# Patient Record
Sex: Male | Born: 1965 | Race: White | Hispanic: No | Marital: Married | Smoking: Never smoker
Health system: Southern US, Community
[De-identification: ages and names within clinical notes are randomized; demographics above are authoritative.]

## PROBLEM LIST (undated history)

## (undated) DIAGNOSIS — M1990 Unspecified osteoarthritis, unspecified site: Secondary | ICD-10-CM

## (undated) HISTORY — PX: SHOULDER ARTHROSCOPY: SHX128

---
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
## 2000-12-07 HISTORY — PX: RHINOPLASTY: SUR1284

## 2006-10-05 ENCOUNTER — Ambulatory Visit: Payer: Self-pay | Admitting: Family Medicine

## 2008-08-21 ENCOUNTER — Ambulatory Visit: Payer: Self-pay | Admitting: Family Medicine

## 2008-08-21 LAB — CONVERTED CEMR LAB
Bilirubin Urine: NEGATIVE
Blood in Urine, dipstick: NEGATIVE
Ketones, urine, test strip: NEGATIVE
Protein, U semiquant: NEGATIVE

## 2008-08-24 LAB — CONVERTED CEMR LAB
ALT: 24 units/L (ref 0–53)
AST: 21 units/L (ref 0–37)
Alkaline Phosphatase: 64 units/L (ref 39–117)
Basophils Absolute: 0.1 10*3/uL (ref 0.0–0.1)
Basophils Relative: 0.7 % (ref 0.0–3.0)
Bilirubin, Direct: 0.1 mg/dL (ref 0.0–0.3)
CO2: 31 meq/L (ref 19–32)
Chloride: 111 meq/L (ref 96–112)
GFR calc Af Amer: 135 mL/min
GFR calc non Af Amer: 112 mL/min
Glucose, Bld: 99 mg/dL (ref 70–99)
HDL: 32.8 mg/dL — ABNORMAL LOW (ref >39.0)
Hemoglobin: 15.2 g/dL (ref 13.0–17.0)
Lymphocytes Relative: 41.8 % (ref 12.0–46.0)
MCHC: 34.6 g/dL (ref 30.0–36.0)
Monocytes Relative: 8 % (ref 3.0–12.0)
Potassium: 4.3 meq/L (ref 3.5–5.1)
RBC: 4.59 M/uL (ref 4.22–5.81)
Total Protein: 7.1 g/dL (ref 6.0–8.3)
VLDL: 29 mg/dL (ref 0–40)
WBC: 7.4 10*3/uL (ref 4.5–10.5)

## 2008-08-28 ENCOUNTER — Ambulatory Visit: Payer: Self-pay | Admitting: Family Medicine

## 2008-12-07 HISTORY — PX: HIP ARTHROSCOPY: SUR88

## 2009-06-06 ENCOUNTER — Encounter: Admission: RE | Admit: 2009-06-06 | Discharge: 2009-06-06 | Payer: Self-pay | Admitting: Orthopedic Surgery

## 2009-09-11 ENCOUNTER — Ambulatory Visit (HOSPITAL_COMMUNITY): Admission: RE | Admit: 2009-09-11 | Discharge: 2009-09-11 | Payer: Self-pay | Admitting: Orthopedic Surgery

## 2010-09-20 IMAGING — RF DG HIP OPERATIVE*L*
1 series · 1 of 1 positions shown · non-contrast
Comparison: None

CLINICAL DATA: Intraoperative left hip for arthroscopy.

OPERATIVE LEFT HIP

[Series 1: run · 1 of 1 slices shown]
[im 1/1]
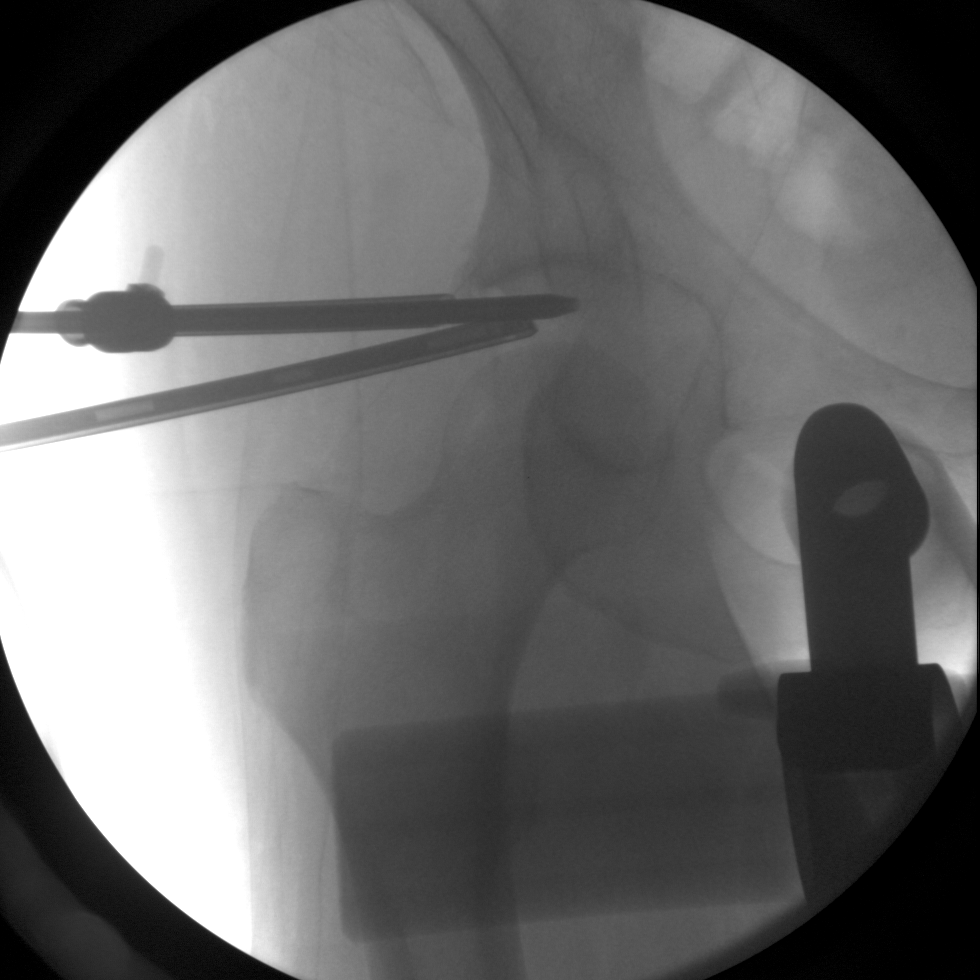

[1 of 1 positions shown; findings below may reference images not displayed]

FINDINGS: A single frontal view demonstrates surgical devices
projecting over the hip joint superiorly.  No acute findings.
IMPRESSION: Intraoperative localization of the hip joint.

## 2011-03-12 LAB — CBC
Hemoglobin: 15.4 g/dL (ref 13.0–17.0)
WBC: 9.4 10*3/uL (ref 4.0–10.5)

## 2011-12-08 HISTORY — PX: SHOULDER ARTHROSCOPY: SHX128

## 2014-03-29 ENCOUNTER — Other Ambulatory Visit: Payer: Self-pay | Admitting: Orthopedic Surgery

## 2014-04-19 NOTE — Pre-Procedure Instructions (Addendum)
Alcide Evenerhomas F Uptain  04/19/2014   Your procedure is scheduled on:  Friday, May 29th  Report to [facility] Admitting at 1015 AM.  Call this number if you have problems the morning of surgery: [phone]   Remember:   Do not eat food or drink liquids after midnight.   Take these medicines the morning of surgery with A SIP OF WATER: pain med if needed,adderall,        Take all meds as ordered until day of surgery except as instructed below pr per dr       Despina AriasSTOP all herbel meds, nsaids (aleve,naproxen,advil,ibuprofen) 5 days prior to surgery including aspirin, vitamin   Do not wear jewelry, make-up or nail polish.  Do not wear lotions, powders, or perfumes. You may wear deodorant.  Do not shave 48 hours prior to surgery. Men may shave face and neck.  Do not bring valuables to the hospital.  [facility] is not responsible  for any belongings or valuables.               Contacts, dentures or bridgework may not be worn into surgery.  Leave suitcase in the car. After surgery it may be brought to your room.  For patients admitted to the hospital, discharge time is determined by your   treatment team.  Please read over the following fact sheets that you were given: Pain Booklet, Coughing and Deep Breathing, Blood Transfusion Information, MRSA Information and Surgical Site Infection Prevention [place] - Preparing for Surgery  Before surgery, you can play an important role.  Because skin is not sterile, your skin needs to be as free of germs as possible.  You can reduce the number of germs on you skin by washing with CHG (chlorahexidine gluconate) soap before surgery.  CHG is an antiseptic cleaner which kills germs and bonds with the skin to continue killing germs even after washing.  Please DO NOT use if you have an allergy to CHG or antibacterial soaps.  If your skin becomes reddened/irritated stop using the CHG and inform your nurse when you arrive at Short Stay.  Do not shave  (including legs and underarms) for at least 48 hours prior to the first CHG shower.  You may shave your face.  Please follow these instructions carefully:   1.  Shower with CHG Soap the night before surgery and the morning of Surgery.  2.  If you choose to wash your hair, wash your hair first as usual with your normal shampoo.  3.  After you shampoo, rinse your hair and body thoroughly to remove the shampoo.  4.  Use CHG as you would any other liquid soap.  You can apply CHG directly to the skin and wash gently with scrungie or a clean washcloth.  5.  Apply the CHG Soap to your body ONLY FROM THE NECK DOWN.  Do not use on open wounds or open sores.  Avoid contact with your eyes, ears, mouth and genitals (private parts).  Wash genitals (private parts) with your normal soap.  6.  Wash thoroughly, paying special attention to the area where your surgery will be performed.  7.  Thoroughly rinse your body with warm water from the neck down.  8.  DO NOT shower/wash with your normal soap after using and rinsing off the CHG Soap.  9.  Pat yourself dry with a clean towel.            10.  Wear clean pajamas.  11.  Place clean sheets on your bed the night of your first shower and do not sleep with pets.  Day of Surgery  Do not apply any lotions/deoderants the morning of surgery.  Please wear clean clothes to the hospital/surgery center.

## 2014-04-20 ENCOUNTER — Encounter (HOSPITAL_COMMUNITY)
Admission: RE | Admit: 2014-04-20 | Discharge: 2014-04-20 | Disposition: A | Discharge status: Home / Self Care | Payer: BC Managed Care – PPO | Source: Ambulatory Visit | Attending: Orthopedic Surgery | Admitting: Orthopedic Surgery

## 2014-04-20 ENCOUNTER — Encounter (HOSPITAL_COMMUNITY): Payer: Self-pay

## 2014-04-20 ENCOUNTER — Encounter (HOSPITAL_COMMUNITY): Payer: Self-pay | Admitting: Pharmacy Technician

## 2014-04-20 DIAGNOSIS — Z01818 Encounter for other preprocedural examination: Secondary | ICD-10-CM | POA: Insufficient documentation

## 2014-04-20 DIAGNOSIS — Z01812 Encounter for preprocedural laboratory examination: Secondary | ICD-10-CM | POA: Insufficient documentation

## 2014-04-20 DIAGNOSIS — Z01810 Encounter for preprocedural cardiovascular examination: Secondary | ICD-10-CM | POA: Insufficient documentation

## 2014-04-20 HISTORY — DX: Unspecified osteoarthritis, unspecified site: M19.90

## 2014-04-20 LAB — CBC WITH DIFFERENTIAL/PLATELET
BASOS ABS: 0.1 10*3/uL (ref 0.0–0.1)
Basophils Relative: 1 % (ref 0–1)
Eosinophils Absolute: 0.4 10*3/uL (ref 0.0–0.7)
Eosinophils Relative: 5 % (ref 0–5)
HCT: 44 % (ref 39.0–52.0)
Hemoglobin: 15.4 g/dL (ref 13.0–17.0)
LYMPHS ABS: 3.3 10*3/uL (ref 0.7–4.0)
LYMPHS PCT: 42 % (ref 12–46)
MCH: 32.1 pg (ref 26.0–34.0)
MCHC: 35 g/dL (ref 30.0–36.0)
MCV: 91.7 fL (ref 78.0–100.0)
MONO ABS: 0.6 10*3/uL (ref 0.1–1.0)
Monocytes Relative: 7 % (ref 3–12)
NEUTROS ABS: 3.6 10*3/uL (ref 1.7–7.7)
Neutrophils Relative %: 45 % (ref 43–77)
Platelets: 226 10*3/uL (ref 150–400)
RBC: 4.8 MIL/uL (ref 4.22–5.81)
RDW: 12.6 % (ref 11.5–15.5)
WBC: 8 10*3/uL (ref 4.0–10.5)

## 2014-04-20 LAB — URINE MICROSCOPIC-ADD ON

## 2014-04-20 LAB — APTT: APTT: 29 s (ref 24–37)

## 2014-04-20 LAB — TYPE AND SCREEN
ABO/RH(D): B POS
ANTIBODY SCREEN: NEGATIVE

## 2014-04-20 LAB — URINALYSIS, ROUTINE W REFLEX MICROSCOPIC
BILIRUBIN URINE: NEGATIVE
Glucose, UA: NEGATIVE mg/dL
Hgb urine dipstick: NEGATIVE
KETONES UR: NEGATIVE mg/dL
Leukocytes, UA: NEGATIVE
Nitrite: NEGATIVE
PH: 6 (ref 5.0–8.0)
Protein, ur: 30 mg/dL — AB
SPECIFIC GRAVITY, URINE: 1.026 (ref 1.005–1.030)
Urobilinogen, UA: 0.2 mg/dL (ref 0.0–1.0)

## 2014-04-20 LAB — BASIC METABOLIC PANEL
BUN: 13 mg/dL (ref 6–23)
CALCIUM: 9.3 mg/dL (ref 8.4–10.5)
CHLORIDE: 104 meq/L (ref 96–112)
CO2: 22 meq/L (ref 19–32)
Creatinine, Ser: 0.77 mg/dL (ref 0.50–1.35)
GFR calc non Af Amer: >90 mL/min (ref >90)
GLUCOSE: 108 mg/dL — AB (ref 70–99)
POTASSIUM: 4.8 meq/L (ref 3.7–5.3)
SODIUM: 141 meq/L (ref 137–147)

## 2014-04-20 LAB — PROTIME-INR
INR: 0.98 (ref 0.00–1.49)
Prothrombin Time: 12.8 seconds (ref 11.6–15.2)

## 2014-04-20 LAB — SURGICAL PCR SCREEN
MRSA, PCR: NEGATIVE
Staphylococcus aureus: POSITIVE — AB

## 2014-04-20 LAB — ABO/RH: ABO/RH(D): B POS

## 2014-05-03 MED ORDER — CEFAZOLIN SODIUM-DEXTROSE 2-3 GM-% IV SOLR
2.0000 g | INTRAVENOUS | Status: AC
  Administered 2014-05-04: 2 g via INTRAVENOUS
  Filled 2014-05-03: qty 50

## 2014-05-03 MED ORDER — CHLORHEXIDINE GLUCONATE 4 % EX LIQD
60.0000 mL | Freq: Once | CUTANEOUS | Status: DC
  Filled 2014-05-03: qty 60

## 2014-05-03 MED ORDER — DEXTROSE-NACL 5-0.45 % IV SOLN: INTRAVENOUS | Status: DC

## 2014-05-03 NOTE — H&P (Signed)
TOTAL HIP ADMISSION H&P  Patient is admitted for left total hip arthroplasty.  Subjective:  Chief Complaint: left hip pain  HPI: Mathew Vega, 48 y.o. male, has a history of pain and functional disability in the left hip(s) due to arthritis and patient has failed non-surgical conservative treatments for greater than 12 weeks to include NSAID's and/or analgesics, corticosteriod injections, weight reduction as appropriate and activity modification.  Onset of symptoms was gradual starting 7 years ago with gradually worsening course since that time.The patient noted no past surgery on the left hip(s).  Patient currently rates pain in the left hip at 10 out of 10 with activity. Patient has night pain, worsening of pain with activity and weight bearing, pain that interfers with activities of daily living and pain with passive range of motion. Patient has evidence of joint space narrowing by imaging studies. This condition presents safety issues increasing the risk of falls. There is no current active infection.  There are no active problems to display for this patient.  Past Medical History  Diagnosis Date  . Arthritis     Past Surgical History  Procedure Laterality Date  . Appendectomy  80  . Rhinoplasty  02  . Hip arthroscopy Left 10  . Shoulder arthroscopy Right 13    cartilage repair    No prescriptions prior to admission   Not on File  History  Substance Use Topics  . Smoking status: Never Smoker   . Smokeless tobacco: Not on file  . Alcohol Use: 2.4 oz/week    4 Glasses of wine per week    No family history on file.   Review of Systems  Constitutional: Negative.   HENT: Negative.   Eyes: Negative.   Respiratory: Negative.   Cardiovascular: Negative.   Gastrointestinal: Negative.   Genitourinary: Negative.   Musculoskeletal: Positive for joint pain.  Skin: Negative.   Neurological: Negative.   Endo/Heme/Allergies: Negative.   Psychiatric/Behavioral: Negative.      Objective:  Physical Exam  Constitutional: He is oriented to person, place, and time. He appears well-developed and well-nourished.  HENT:  Head: Normocephalic and atraumatic.  Eyes: Pupils are equal, round, and reactive to light.  Neck: Normal range of motion. Neck supple.  Cardiovascular: Intact distal pulses.   Respiratory: Effort normal.  Musculoskeletal: He exhibits tenderness.  Patient's right hip has good strength good range of motion and no pain.  Patient's left hip does have obvious pain with hip flexion and extension.  He also has increased pain with internal and external rotation.  Patient has mild groin pain.  No lateral hip pain.  His calves are soft and nontender.  He is neurovascularly intact distally.  Neurological: He is alert and oriented to person, place, and time.  Skin: Skin is warm and dry.  Psychiatric: He has a normal mood and affect. His behavior is normal. Judgment and thought content normal.    Vital signs in last 24 hours:    Labs:   Estimated body mass index is 26.97 kg/(m^2) as calculated from the following:   Height as of 08/28/08: 5' 10.75" (1.797 m).   Weight as of 08/28/08: 87.091 kg (192 lb).   Imaging Review Pelvis AP and the left hip lateral 2 views x-ray was ordered, performed and interpreted by me in office, which shows end-stage degenerative joint disease on the left side with the bone on bone.  Assessment/Plan:  End stage arthritis, left hip(s)  The patient history, physical examination, clinical judgement of the  provider and imaging studies are consistent with end stage degenerative joint disease of the left hip(s) and total hip arthroplasty is deemed medically necessary. The treatment options including medical management, injection therapy, arthroscopy and arthroplasty were discussed at length. The risks and benefits of total hip arthroplasty were presented and reviewed. The risks due to aseptic loosening, infection, stiffness,  dislocation/subluxation,  thromboembolic complications and other imponderables were discussed.  The patient acknowledged the explanation, agreed to proceed with the plan and consent was signed. Patient is being admitted for inpatient treatment for surgery, pain control, PT, OT, prophylactic antibiotics, VTE prophylaxis, progressive ambulation and ADL's and discharge planning.The patient is planning to be discharged home with home health services

## 2014-05-03 NOTE — Progress Notes (Signed)
Called and informed of time change.Pt to be here at 1052, voices understanding.

## 2014-05-04 ENCOUNTER — Encounter (HOSPITAL_COMMUNITY)
Admission: RE | Disposition: A | Discharge status: Home Health | Payer: Self-pay | Source: Ambulatory Visit | Attending: Orthopedic Surgery

## 2014-05-04 ENCOUNTER — Encounter (HOSPITAL_COMMUNITY): Payer: BC Managed Care – PPO | Admitting: Anesthesiology

## 2014-05-04 ENCOUNTER — Inpatient Hospital Stay (HOSPITAL_COMMUNITY): Payer: BC Managed Care – PPO | Admitting: Anesthesiology

## 2014-05-04 ENCOUNTER — Inpatient Hospital Stay (HOSPITAL_COMMUNITY): Payer: BC Managed Care – PPO

## 2014-05-04 ENCOUNTER — Encounter (HOSPITAL_COMMUNITY): Payer: Self-pay | Admitting: *Deleted

## 2014-05-04 ENCOUNTER — Inpatient Hospital Stay (HOSPITAL_COMMUNITY)
Admission: RE | Admit: 2014-05-04 | Discharge: 2014-05-05 | DRG: 470 | Disposition: A | Discharge status: Home Health | Payer: BC Managed Care – PPO | Source: Ambulatory Visit | Attending: Orthopedic Surgery | Admitting: Orthopedic Surgery

## 2014-05-04 DIAGNOSIS — M169 Osteoarthritis of hip, unspecified: Principal | ICD-10-CM | POA: Diagnosis present

## 2014-05-04 DIAGNOSIS — M1612 Unilateral primary osteoarthritis, left hip: Secondary | ICD-10-CM | POA: Diagnosis present

## 2014-05-04 DIAGNOSIS — M1610 Unilateral primary osteoarthritis, unspecified hip: Principal | ICD-10-CM | POA: Diagnosis present

## 2014-05-04 HISTORY — PX: INJECTION KNEE: SHX2446

## 2014-05-04 HISTORY — PX: TOTAL HIP ARTHROPLASTY: SHX124

## 2014-05-04 SURGERY — ARTHROPLASTY, HIP, TOTAL,POSTERIOR APPROACH
Anesthesia: General | Site: Knee | Laterality: Left

## 2014-05-04 MED ORDER — FENTANYL CITRATE 0.05 MG/ML IJ SOLN
INTRAMUSCULAR | Status: AC
  Filled 2014-05-04: qty 5

## 2014-05-04 MED ORDER — OXYCODONE HCL 5 MG PO TABS
5.0000 mg | ORAL_TABLET | Freq: Once | ORAL | Status: AC | PRN
  Administered 2014-05-04: 5 mg via ORAL

## 2014-05-04 MED ORDER — BUPIVACAINE-EPINEPHRINE (PF) 0.5% -1:200000 IJ SOLN
INTRAMUSCULAR | Status: AC
  Filled 2014-05-04: qty 30

## 2014-05-04 MED ORDER — ONDANSETRON HCL 4 MG/2ML IJ SOLN: 4.0000 mg | Freq: Once | INTRAMUSCULAR | Status: DC | PRN

## 2014-05-04 MED ORDER — ARTIFICIAL TEARS OP OINT
TOPICAL_OINTMENT | OPHTHALMIC | Status: DC | PRN
  Administered 2014-05-04: 1 via OPHTHALMIC

## 2014-05-04 MED ORDER — MAGNESIUM CITRATE PO SOLN: 1.0000 | Freq: Once | ORAL | Status: AC | PRN

## 2014-05-04 MED ORDER — SENNOSIDES-DOCUSATE SODIUM 8.6-50 MG PO TABS: 1.0000 | ORAL_TABLET | Freq: Every evening | ORAL | Status: DC | PRN

## 2014-05-04 MED ORDER — METOCLOPRAMIDE HCL 5 MG/ML IJ SOLN: 5.0000 mg | Freq: Three times a day (TID) | INTRAMUSCULAR | Status: DC | PRN

## 2014-05-04 MED ORDER — GLYCOPYRROLATE 0.2 MG/ML IJ SOLN
INTRAMUSCULAR | Status: AC
  Filled 2014-05-04: qty 2

## 2014-05-04 MED ORDER — OXYCODONE HCL 5 MG/5ML PO SOLN: 5.0000 mg | Freq: Once | ORAL | Status: AC | PRN

## 2014-05-04 MED ORDER — METHOCARBAMOL 500 MG PO TABS
500.0000 mg | ORAL_TABLET | Freq: Four times a day (QID) | ORAL | Status: DC | PRN
  Administered 2014-05-04 – 2014-05-05 (×3): 500 mg via ORAL
  Filled 2014-05-04 (×3): qty 1

## 2014-05-04 MED ORDER — HYDROMORPHONE HCL PF 1 MG/ML IJ SOLN
1.0000 mg | INTRAMUSCULAR | Status: DC | PRN
  Administered 2014-05-04: 1 mg via INTRAVENOUS
  Filled 2014-05-04 (×2): qty 1

## 2014-05-04 MED ORDER — GLYCOPYRROLATE 0.2 MG/ML IJ SOLN
INTRAMUSCULAR | Status: DC | PRN
  Administered 2014-05-04: 0.4 mg via INTRAVENOUS

## 2014-05-04 MED ORDER — DIPHENHYDRAMINE HCL 12.5 MG/5ML PO ELIX: 12.5000 mg | ORAL_SOLUTION | ORAL | Status: DC | PRN

## 2014-05-04 MED ORDER — OXYCODONE HCL 5 MG PO TABS
ORAL_TABLET | ORAL | Status: AC
  Filled 2014-05-04: qty 1

## 2014-05-04 MED ORDER — ONDANSETRON HCL 4 MG/2ML IJ SOLN: 4.0000 mg | Freq: Four times a day (QID) | INTRAMUSCULAR | Status: DC | PRN

## 2014-05-04 MED ORDER — BISACODYL 5 MG PO TBEC: 5.0000 mg | DELAYED_RELEASE_TABLET | Freq: Every day | ORAL | Status: DC | PRN

## 2014-05-04 MED ORDER — LACTATED RINGERS IV SOLN
INTRAVENOUS | Status: DC | PRN
  Administered 2014-05-04 (×2): via INTRAVENOUS

## 2014-05-04 MED ORDER — ARTIFICIAL TEARS OP OINT
TOPICAL_OINTMENT | OPHTHALMIC | Status: AC
Start: 2014-05-04 — End: 2014-05-04
  Filled 2014-05-04: qty 3.5

## 2014-05-04 MED ORDER — DOCUSATE SODIUM 100 MG PO CAPS
100.0000 mg | ORAL_CAPSULE | Freq: Two times a day (BID) | ORAL | Status: DC
  Administered 2014-05-05: 100 mg via ORAL
  Filled 2014-05-04 (×3): qty 1

## 2014-05-04 MED ORDER — NEOSTIGMINE METHYLSULFATE 10 MG/10ML IV SOLN
INTRAVENOUS | Status: AC
  Filled 2014-05-04: qty 1

## 2014-05-04 MED ORDER — BETAMETHASONE SOD PHOS & ACET 6 (3-3) MG/ML IJ SUSP
INTRAMUSCULAR | Status: DC | PRN
  Administered 2014-05-04: 18 mg via INTRAMUSCULAR

## 2014-05-04 MED ORDER — STERILE WATER FOR INJECTION IJ SOLN
INTRAMUSCULAR | Status: AC
  Filled 2014-05-04: qty 10

## 2014-05-04 MED ORDER — ACETAMINOPHEN 650 MG RE SUPP: 650.0000 mg | Freq: Four times a day (QID) | RECTAL | Status: DC | PRN

## 2014-05-04 MED ORDER — METHOCARBAMOL 1000 MG/10ML IJ SOLN
500.0000 mg | Freq: Four times a day (QID) | INTRAVENOUS | Status: DC | PRN
  Administered 2014-05-04: 500 mg via INTRAVENOUS
  Filled 2014-05-04 (×2): qty 5

## 2014-05-04 MED ORDER — AMPHETAMINE-DEXTROAMPHET ER 10 MG PO CP24
20.0000 mg | ORAL_CAPSULE | Freq: Every day | ORAL | Status: DC
  Administered 2014-05-05: 20 mg via ORAL
  Filled 2014-05-04: qty 2

## 2014-05-04 MED ORDER — LABETALOL HCL 5 MG/ML IV SOLN
INTRAVENOUS | Status: DC | PRN
Start: 2014-05-04 — End: 2014-05-04
  Administered 2014-05-04: 5 mg via INTRAVENOUS

## 2014-05-04 MED ORDER — HYDROMORPHONE HCL PF 1 MG/ML IJ SOLN
INTRAMUSCULAR | Status: AC
  Filled 2014-05-04: qty 1

## 2014-05-04 MED ORDER — ASPIRIN EC 325 MG PO TBEC
325.0000 mg | DELAYED_RELEASE_TABLET | Freq: Every day | ORAL | Status: DC
  Administered 2014-05-05: 325 mg via ORAL
  Filled 2014-05-04 (×2): qty 1

## 2014-05-04 MED ORDER — BETAMETHASONE SOD PHOS & ACET 6 (3-3) MG/ML IJ SUSP
18.0000 mg | Freq: Once | INTRAMUSCULAR | Status: DC
  Filled 2014-05-04: qty 3

## 2014-05-04 MED ORDER — PROPOFOL 10 MG/ML IV BOLUS
INTRAVENOUS | Status: DC | PRN
  Administered 2014-05-04: 200 mg via INTRAVENOUS

## 2014-05-04 MED ORDER — METHOCARBAMOL 500 MG PO TABS: 500.0000 mg | ORAL_TABLET | Freq: Two times a day (BID) | ORAL | Status: AC

## 2014-05-04 MED ORDER — CEFUROXIME SODIUM 1.5 G IJ SOLR
INTRAMUSCULAR | Status: AC
  Filled 2014-05-04: qty 1.5

## 2014-05-04 MED ORDER — OXYCODONE-ACETAMINOPHEN 5-325 MG PO TABS: 1.0000 | ORAL_TABLET | ORAL | Status: DC | PRN

## 2014-05-04 MED ORDER — 0.9 % SODIUM CHLORIDE (POUR BTL) OPTIME
TOPICAL | Status: DC | PRN
  Administered 2014-05-04: 1000 mL

## 2014-05-04 MED ORDER — LACTATED RINGERS IV SOLN
INTRAVENOUS | Status: DC
  Administered 2014-05-04: 11:00:00 via INTRAVENOUS

## 2014-05-04 MED ORDER — LIDOCAINE HCL (CARDIAC) 20 MG/ML IV SOLN
INTRAVENOUS | Status: DC | PRN
  Administered 2014-05-04: 100 mg via INTRATRACHEAL

## 2014-05-04 MED ORDER — ONDANSETRON HCL 4 MG/2ML IJ SOLN
INTRAMUSCULAR | Status: DC | PRN
Start: 2014-05-04 — End: 2014-05-04
  Administered 2014-05-04: 4 mg via INTRAVENOUS

## 2014-05-04 MED ORDER — LABETALOL HCL 5 MG/ML IV SOLN
INTRAVENOUS | Status: AC
  Filled 2014-05-04: qty 4

## 2014-05-04 MED ORDER — BUPIVACAINE-EPINEPHRINE 0.5% -1:200000 IJ SOLN
INTRAMUSCULAR | Status: DC | PRN
  Administered 2014-05-04: 17 mL

## 2014-05-04 MED ORDER — TRANEXAMIC ACID 100 MG/ML IV SOLN
1000.0000 mg | INTRAVENOUS | Status: AC
  Administered 2014-05-04: 1000 mg via INTRAVENOUS
  Filled 2014-05-04 (×2): qty 10

## 2014-05-04 MED ORDER — NEOSTIGMINE METHYLSULFATE 10 MG/10ML IV SOLN
INTRAVENOUS | Status: DC | PRN
  Administered 2014-05-04: 3 mg via INTRAVENOUS

## 2014-05-04 MED ORDER — HYDROMORPHONE HCL PF 1 MG/ML IJ SOLN
0.2500 mg | INTRAMUSCULAR | Status: DC | PRN
Start: 2014-05-04 — End: 2014-05-04
  Administered 2014-05-04 (×5): 0.5 mg via INTRAVENOUS

## 2014-05-04 MED ORDER — ASPIRIN EC 325 MG PO TBEC: 325.0000 mg | DELAYED_RELEASE_TABLET | Freq: Two times a day (BID) | ORAL | Status: DC

## 2014-05-04 MED ORDER — HYDROMORPHONE HCL PF 1 MG/ML IJ SOLN
0.5000 mg | INTRAMUSCULAR | Status: DC | PRN
  Administered 2014-05-04 (×2): 0.5 mg via INTRAVENOUS

## 2014-05-04 MED ORDER — OXYCODONE HCL 5 MG PO TABS
5.0000 mg | ORAL_TABLET | ORAL | Status: DC | PRN
  Administered 2014-05-04 – 2014-05-05 (×6): 10 mg via ORAL
  Filled 2014-05-04 (×6): qty 2

## 2014-05-04 MED ORDER — MIDAZOLAM HCL 2 MG/2ML IJ SOLN
INTRAMUSCULAR | Status: AC
  Filled 2014-05-04: qty 2

## 2014-05-04 MED ORDER — MENTHOL 3 MG MT LOZG: 1.0000 | LOZENGE | OROMUCOSAL | Status: DC | PRN

## 2014-05-04 MED ORDER — METOCLOPRAMIDE HCL 10 MG PO TABS: 5.0000 mg | ORAL_TABLET | Freq: Three times a day (TID) | ORAL | Status: DC | PRN

## 2014-05-04 MED ORDER — PHENOL 1.4 % MT LIQD: 1.0000 | OROMUCOSAL | Status: DC | PRN

## 2014-05-04 MED ORDER — LIDOCAINE HCL (CARDIAC) 20 MG/ML IV SOLN
INTRAVENOUS | Status: AC
  Filled 2014-05-04: qty 5

## 2014-05-04 MED ORDER — PROPOFOL 10 MG/ML IV BOLUS
INTRAVENOUS | Status: AC
  Filled 2014-05-04: qty 20

## 2014-05-04 MED ORDER — MIDAZOLAM HCL 5 MG/5ML IJ SOLN
INTRAMUSCULAR | Status: DC | PRN
  Administered 2014-05-04: 2 mg via INTRAVENOUS

## 2014-05-04 MED ORDER — ONDANSETRON HCL 4 MG/2ML IJ SOLN
INTRAMUSCULAR | Status: AC
  Filled 2014-05-04: qty 2

## 2014-05-04 MED ORDER — ACETAMINOPHEN 325 MG PO TABS
650.0000 mg | ORAL_TABLET | Freq: Four times a day (QID) | ORAL | Status: DC | PRN
  Administered 2014-05-05: 650 mg via ORAL
  Filled 2014-05-04: qty 2

## 2014-05-04 MED ORDER — ONDANSETRON HCL 4 MG PO TABS: 4.0000 mg | ORAL_TABLET | Freq: Four times a day (QID) | ORAL | Status: DC | PRN

## 2014-05-04 MED ORDER — ROCURONIUM BROMIDE 100 MG/10ML IV SOLN
INTRAVENOUS | Status: DC | PRN
  Administered 2014-05-04: 50 mg via INTRAVENOUS

## 2014-05-04 MED ORDER — VECURONIUM BROMIDE 10 MG IV SOLR
INTRAVENOUS | Status: AC
  Filled 2014-05-04: qty 10

## 2014-05-04 MED ORDER — AMPHETAMINE-DEXTROAMPHETAMINE 10 MG PO TABS
10.0000 mg | ORAL_TABLET | Freq: Every day | ORAL | Status: DC
  Administered 2014-05-05: 10 mg via ORAL
  Filled 2014-05-04: qty 1

## 2014-05-04 MED ORDER — FENTANYL CITRATE 0.05 MG/ML IJ SOLN
INTRAMUSCULAR | Status: DC | PRN
  Administered 2014-05-04: 100 ug via INTRAVENOUS
  Administered 2014-05-04 (×2): 50 ug via INTRAVENOUS
  Administered 2014-05-04: 100 ug via INTRAVENOUS
  Administered 2014-05-04: 50 ug via INTRAVENOUS
  Administered 2014-05-04: 100 ug via INTRAVENOUS

## 2014-05-04 MED ORDER — KCL IN DEXTROSE-NACL 20-5-0.45 MEQ/L-%-% IV SOLN
INTRAVENOUS | Status: DC
  Administered 2014-05-04: 19:00:00 via INTRAVENOUS
  Filled 2014-05-04 (×4): qty 1000

## 2014-05-04 SURGICAL SUPPLY — 57 items
BLADE SAW SGTL 18X1.27X75 (BLADE) ×3 IMPLANT
BLADE SAW SGTL 18X1.27X75MM (BLADE) ×1
BRUSH FEMORAL CANAL (MISCELLANEOUS) IMPLANT
CAPT HIP PF COP ×4 IMPLANT
COVER BACK TABLE 24X17X13 BIG (DRAPES) IMPLANT
COVER SURGICAL LIGHT HANDLE (MISCELLANEOUS) ×4 IMPLANT
DRAPE ORTHO SPLIT 77X108 STRL (DRAPES) ×2
DRAPE PROXIMA HALF (DRAPES) ×4 IMPLANT
DRAPE SURG ORHT 6 SPLT 77X108 (DRAPES) ×2 IMPLANT
DRAPE U-SHAPE 47X51 STRL (DRAPES) ×4 IMPLANT
DRILL BIT 7/64X5 (BIT) ×4 IMPLANT
DRSG AQUACEL AG ADV 3.5X10 (GAUZE/BANDAGES/DRESSINGS) ×4 IMPLANT
DURAPREP 26ML APPLICATOR (WOUND CARE) ×4 IMPLANT
ELECT BLADE 4.0 EZ CLEAN MEGAD (MISCELLANEOUS) ×4
ELECT REM PT RETURN 9FT ADLT (ELECTROSURGICAL) ×4
ELECTRODE BLDE 4.0 EZ CLN MEGD (MISCELLANEOUS) ×2 IMPLANT
ELECTRODE REM PT RTRN 9FT ADLT (ELECTROSURGICAL) ×2 IMPLANT
GAUZE XEROFORM 1X8 LF (GAUZE/BANDAGES/DRESSINGS) ×4 IMPLANT
GLOVE BIO SURGEON STRL SZ7.5 (GLOVE) ×4 IMPLANT
GLOVE BIO SURGEON STRL SZ8.5 (GLOVE) ×8 IMPLANT
GLOVE BIOGEL PI IND STRL 6.5 (GLOVE) ×4 IMPLANT
GLOVE BIOGEL PI IND STRL 8 (GLOVE) ×4 IMPLANT
GLOVE BIOGEL PI IND STRL 9 (GLOVE) ×2 IMPLANT
GLOVE BIOGEL PI INDICATOR 6.5 (GLOVE) ×4
GLOVE BIOGEL PI INDICATOR 8 (GLOVE) ×4
GLOVE BIOGEL PI INDICATOR 9 (GLOVE) ×2
GLOVE SURG SS PI 6.5 STRL IVOR (GLOVE) ×4 IMPLANT
GOWN STRL REUS W/ TWL LRG LVL3 (GOWN DISPOSABLE) ×4 IMPLANT
GOWN STRL REUS W/ TWL XL LVL3 (GOWN DISPOSABLE) ×6 IMPLANT
GOWN STRL REUS W/TWL LRG LVL3 (GOWN DISPOSABLE) ×4
GOWN STRL REUS W/TWL XL LVL3 (GOWN DISPOSABLE) ×6
HANDPIECE INTERPULSE COAX TIP (DISPOSABLE)
HOOD PEEL AWAY FACE SHEILD DIS (HOOD) ×8 IMPLANT
KIT BASIN OR (CUSTOM PROCEDURE TRAY) ×4 IMPLANT
KIT ROOM TURNOVER OR (KITS) ×4 IMPLANT
MANIFOLD NEPTUNE II (INSTRUMENTS) ×4 IMPLANT
NEEDLE 22X1 1/2 (OR ONLY) (NEEDLE) ×4 IMPLANT
NS IRRIG 1000ML POUR BTL (IV SOLUTION) ×4 IMPLANT
PACK TOTAL JOINT (CUSTOM PROCEDURE TRAY) ×4 IMPLANT
PAD ARMBOARD 7.5X6 YLW CONV (MISCELLANEOUS) ×8 IMPLANT
PASSER SUT SWANSON 36MM LOOP (INSTRUMENTS) ×4 IMPLANT
PRESSURIZER FEMORAL UNIV (MISCELLANEOUS) IMPLANT
SET HNDPC FAN SPRY TIP SCT (DISPOSABLE) IMPLANT
SUT ETHIBOND 2 V 37 (SUTURE) ×4 IMPLANT
SUT VIC AB 0 CTB1 27 (SUTURE) ×4 IMPLANT
SUT VIC AB 1 CTX 27 (SUTURE) ×4 IMPLANT
SUT VIC AB 1 CTX 36 (SUTURE) ×2
SUT VIC AB 1 CTX36XBRD ANBCTR (SUTURE) ×2 IMPLANT
SUT VIC AB 2-0 CTB1 (SUTURE) ×4 IMPLANT
SUT VIC AB 3-0 SH 27 (SUTURE) ×2
SUT VIC AB 3-0 SH 27X BRD (SUTURE) ×2 IMPLANT
SYR CONTROL 10ML LL (SYRINGE) ×4 IMPLANT
TOWEL OR 17X24 6PK STRL BLUE (TOWEL DISPOSABLE) ×4 IMPLANT
TOWEL OR 17X26 10 PK STRL BLUE (TOWEL DISPOSABLE) ×4 IMPLANT
TOWER CARTRIDGE SMART MIX (DISPOSABLE) IMPLANT
TRAY FOLEY CATH 14FR (SET/KITS/TRAYS/PACK) IMPLANT
WATER STERILE IRR 1000ML POUR (IV SOLUTION) ×4 IMPLANT

## 2014-05-04 NOTE — Anesthesia Postprocedure Evaluation (Signed)
  Anesthesia Post-op Note  Patient: Mathew Vega  Procedure(s) Performed: Procedure(s): TOTAL HIP ARTHROPLASTY (Left) CORTISONE INJECTION LEFT KNEE (Left)  Patient Location: PACU  Anesthesia Type:General  Level of Consciousness: awake, alert  and oriented  Airway and Oxygen Therapy: Patient Spontanous Breathing and Patient connected to nasal cannula oxygen  Post-op Pain: mild  Post-op Assessment: Post-op Vital signs reviewed  Post-op Vital Signs: Reviewed  Last Vitals:  Filed Vitals:   05/04/14 1515  BP: 140/83  Pulse: 59  Temp:   Resp: 7    Complications: No apparent anesthesia complications

## 2014-05-04 NOTE — Transfer of Care (Signed)
Immediate Anesthesia Transfer of Care Note  Patient: Mathew Vega  Procedure(s) Performed: Procedure(s): TOTAL HIP ARTHROPLASTY (Left) CORTISONE INJECTION LEFT KNEE (Left)  Patient Location: PACU  Anesthesia Type:General  Level of Consciousness: awake, oriented and patient cooperative  Airway & Oxygen Therapy: Patient Spontanous Breathing and Patient connected to nasal cannula oxygen  Post-op Assessment: Report given to PACU RN and Post -op Vital signs reviewed and stable  Post vital signs: Reviewed  Complications: No apparent anesthesia complications

## 2014-05-04 NOTE — Interval H&P Note (Signed)
History and Physical Interval Note:  05/04/2014 12:10 PM  Mathew Vega  has presented today for surgery, with the diagnosis of LEFT HIP OSTEOARTHRITIS  The various methods of treatment have been discussed with the patient and family. After consideration of risks, benefits and other options for treatment, the patient has consented to  Procedure(s): TOTAL HIP ARTHROPLASTY (Left) as a surgical intervention .  The patient's history has been reviewed, patient examined, no change in status, stable for surgery.  I have reviewed the patient's chart and labs.  Questions were answered to the patient's satisfaction.     Nestor Lewandowsky

## 2014-05-04 NOTE — Anesthesia Preprocedure Evaluation (Addendum)
Anesthesia Evaluation  Patient identified by MRN, date of birth, ID band Patient awake    Reviewed: Allergy & Precautions, H&P , NPO status , Patient's Chart, lab work & pertinent test results  History of Anesthesia Complications Negative for: history of anesthetic complications  Airway Mallampati: II TM Distance: >3 FB Neck ROM: Full    Dental  (+) Teeth Intact, Dental Advisory Given   Pulmonary neg pulmonary ROS,  breath sounds clear to auscultation        Cardiovascular negative cardio ROS  Rhythm:Regular Rate:Normal     Neuro/Psych negative psych ROS   GI/Hepatic negative GI ROS, Neg liver ROS,   Endo/Other  negative endocrine ROS  Renal/GU negative Renal ROS     Musculoskeletal   Abdominal   Peds  Hematology negative hematology ROS (+)   Anesthesia Other Findings   Reproductive/Obstetrics negative OB ROS                          Anesthesia Physical Anesthesia Plan  ASA: I  Anesthesia Plan: General   Post-op Pain Management:    Induction: Intravenous  Airway Management Planned: Oral ETT  Additional Equipment:   Intra-op Plan:   Post-operative Plan: Extubation in OR  Informed Consent: I have reviewed the patients History and Physical, chart, labs and discussed the procedure including the risks, benefits and alternatives for the proposed anesthesia with the patient or authorized representative who has indicated his/her understanding and acceptance.   Dental advisory given  Plan Discussed with: CRNA, Anesthesiologist and Surgeon  Anesthesia Plan Comments:         Anesthesia Quick Evaluation

## 2014-05-04 NOTE — H&P (View-Only) (Signed)
Called and informed of time change.Pt to be here at 1052, voices understanding. 

## 2014-05-04 NOTE — Plan of Care (Signed)
Problem: Consults Goal: Diagnosis- Total Joint Replacement Primary Total Hip Left     

## 2014-05-04 NOTE — Anesthesia Procedure Notes (Signed)
Procedure Name: Intubation Date/Time: 05/04/2014 1:10 PM Performed by: Romie Minus K Pre-anesthesia Checklist: Patient identified, Emergency Drugs available, Suction available, Patient being monitored and Timeout performed Patient Re-evaluated:Patient Re-evaluated prior to inductionOxygen Delivery Method: Circle system utilized Preoxygenation: Pre-oxygenation with 100% oxygen Intubation Type: IV induction Ventilation: Mask ventilation without difficulty and Oral airway inserted - appropriate to patient size Laryngoscope Size: Miller and 2 Grade View: Grade I Tube type: Oral Tube size: 7.5 mm Number of attempts: 1 Airway Equipment and Method: Stylet Placement Confirmation: ETT inserted through vocal cords under direct vision,  positive ETCO2,  CO2 detector and breath sounds checked- equal and bilateral Secured at: 22 cm Tube secured with: Tape Dental Injury: Teeth and Oropharynx as per pre-operative assessment

## 2014-05-04 NOTE — Op Note (Signed)
OPERATIVE REPORT    DATE OF PROCEDURE:  05/04/2014       PREOPERATIVE DIAGNOSIS:  LEFT HIP OSTEOARTHRITIS                                                          POSTOPERATIVE DIAGNOSIS:  LEFT HIP OSTEOARTHRITIS                                                           PROCEDURE:  L total hip arthroplasty using a 54 mm DePuy Pinnacle  Cup, Pe[employer]ody E[employer]x Hole Eliminator, 10-degree polyethylene liner index superior  and posterior, a +0 36 mm ceramic head, a [id number] SROM stem, 18FL Sleeve   SURGEON: Nestor LewandowskyFrank J Favor Kreh    ASSISTANT:   Tomi LikensEric K. [employer] PA-C  (present throughout entire procedure and necessary for timely completion of the procedure)   ANESTHESIA: General BLOOD LOSS: 500 FLUID REPLACEMENT: 1800 crystalloid DRAINS: Foley Catheter URINE OUTPUT: 300cc COMPLICATIONS: none    INDICATIONS FOR PROCEDURE: A 48 y.o. year-old With  LEFT HIP OSTEOARTHRITIS   for 2 years, x-rays show bone-on-bone arthritic changes. Despite conservative measures with observation, anti-inflammatory medicine, narcotics, use of a cane, has severe unremitting pain and can ambulate only a few blocks before resting.  Patient desires elective L total hip arthroplasty to decrease pain and increase function. The risks, benefits, and alternatives were discussed at length including but not limited to the risks of infection, bleeding, nerve injury, stiffness, blood clots, the need for revision surgery, cardiopulmonary complications, among others, and they were willing to proceed. Questions answered     PROCEDURE IN DETAIL: The patient was identified by armband,  received preoperative [facility], taken to the operating room , appropriate anesthetic monitors  were attached and general endotracheal anesthesia induced. Foley catheter was inserted. Pt was rolled into the R lateral decubitus position and fixed there with a Stulberg Mark II pelvic clamp.  The L lower extremity was then  prepped and draped  in the usual sterile fashion from the ankle to the hemipelvis. A time-out  procedure was performed. The skin along the lateral hip and thigh  infiltrated with 10 mL of 0.5% Marcaine and epinephrine solution. We  then made a posterolateral approach to the hip. With a #10 blade, a 15 cm  incision was made through the skin and subcutaneous tissue down to the level of the  IT band. Small bleeders were identified and cauterized. The IT band was cut in  line with skin incision exposing the greater trochanter. A Cobra retractor was placed between the gluteus minimus and the superior hip joint capsule, and a spiked Cobra between the quadratus femoris and the inferior hip joint capsule. This isolated the short  external rotators and piriformis tendons. These were tagged with a #2 Ethibond  suture and cut off their insertion on the intertrochanteric crest. The posterior  capsule was then developed into an acetabular-based flap from Posterior Superior off of the acetabulum out over the femoral neck and back posterior inferior to the acetabular rim. This flap was tagged with two #2  Ethibond sutures and retracted protecting the sciatic nerve. This exposed the arthritic femoral head and osteophytes. The hip was then flexed and internally rotated, dislocating the femoral head and a standard neck cut performed 1 fingerbreadth above the lesser trochanter.  A spiked Cobra was placed in the cotyloid notch and a Hohmann retractor was then used to lever the femur anteriorly off of the anterior pelvic column. A posterior-inferior wing retractor was placed at the junction of the acetabulum and the ischium completing the acetabular exposure.We then removed the peripheral osteophytes and labrum from the acetabulum. We then reamed the acetabulum up to 53 mm with basket reamers obtaining good coverage in all quadrants. We then irrigated with normal  saline solution and hammered into place a 54 mm pinnacle cup  in 45  degrees of abduction and about 20 degrees of anteversion. More  peripheral osteophytes removed and a trial 10-degree liner placed with the  index superior-posterior. The hip was then flexed and internally rotated exposing the  proximal femur, which was entered with the initiating reamer followed by  the axial reamers up to a 13.5 mm full depth and 16mm partial depth. We then conically reamed to 67F to the correct depth for a 42 base neck. The calcar was milled to 67FL. A trial cone and stem was inserted in the 25 degrees anteversion, with a +0 65mm trial head. Trial reduction was then performed and excellent stability was noted with at 90 of flexion with 75 of internal rotation and then full extension with maximal external rotation. The hip could not be dislocated in full extension. The knee could easily flex  to about 130 degrees. We also stretched the abductors at this point,  because of the preexisting adductor contractures. All trial components  were then removed. The acetabulum was irrigated out with normal saline  solution. A titanium [facility] was then screwed into place  followed by a 10-degree polyethylene liner index superior-posterior. On  the femoral side a 67FL ZTT1 sleeve was hammered into place, followed by a [id number] SROM stem in 25 degrees of anteversion. At this point, a +0 36 mm ceramic head was  hammered on the stem. The hip was reduced. We checked our stability  one more time and found it to be excellent. The wound was once again  thoroughly irrigated out with normal saline solution pulse lavage. The  capsular flap and short external rotators were repaired back to the  intertrochanteric crest through drill holes with a #2 Ethibond suture.  The IT band was closed with running 1 Vicryl suture. The subcutaneous  tissue with 0 and 2-0 undyed Vicryl suture and the skin with running  interlocking 3-0 nylon suture. Dressing of Xeroform and Mepilex was  then  applied. The patient was then unclamped, rolled supine, awaken extubated and taken to recovery room without difficulty in stable condition.   Nestor Lewandowsky 05/04/2014, 2:35 PM

## 2014-05-05 LAB — CBC
HEMATOCRIT: 43.4 % (ref 39.0–52.0)
HEMOGLOBIN: 15.1 g/dL (ref 13.0–17.0)
MCH: 32.4 pg (ref 26.0–34.0)
MCHC: 34.8 g/dL (ref 30.0–36.0)
MCV: 93.1 fL (ref 78.0–100.0)
Platelets: 295 10*3/uL (ref 150–400)
RBC: 4.66 MIL/uL (ref 4.22–5.81)
RDW: 13 % (ref 11.5–15.5)
WBC: 19.2 10*3/uL — AB (ref 4.0–10.5)

## 2014-05-05 LAB — BASIC METABOLIC PANEL
BUN: 8 mg/dL (ref 6–23)
CHLORIDE: 99 meq/L (ref 96–112)
CO2: 26 mEq/L (ref 19–32)
Calcium: 9.4 mg/dL (ref 8.4–10.5)
Creatinine, Ser: 0.7 mg/dL (ref 0.50–1.35)
Glucose, Bld: 184 mg/dL — ABNORMAL HIGH (ref 70–99)
POTASSIUM: 4.7 meq/L (ref 3.7–5.3)
SODIUM: 136 meq/L — AB (ref 137–147)

## 2014-05-05 NOTE — Progress Notes (Signed)
Physical Therapy Evaluation Patient Details Name: Mathew Vega MRN: [id number] DOB: 02-Dec-1966 Today's Date: 05/05/2014   History of Present Illness  48 y.o. male s/p left THA.  Clinical Impression  Pt is s/p left THA resulting in the deficits listed below (see PT Problem List). Ambulates up to 150 feet at supervision level and has safely completed stair training. Pt reports he feels safe to d/c home.  Pt will benefit from skilled PT to increase their independence and safety with mobility to allow discharge to the venue listed below. Patient is safe for d/c home from PT standpoint, all pertinent education has been reviewed and patient has no further concerns at this. Will continue to follow until d/c.      Follow Up Recommendations Home health PT;Supervision - Intermittent    Equipment Recommendations  None recommended by PT    Recommendations for Other Services OT consult     Precautions / Restrictions Precautions Precautions: Posterior Hip Precaution Booklet Issued:  (One in room) Precaution Comments: Reviewed precautions. Pt able to state 3/3 at end of session Restrictions Weight Bearing Restrictions: Yes LLE Weight Bearing: Weight bearing as tolerated      Mobility  Bed Mobility               General bed mobility comments: pt in chair  Transfers Overall transfer level: Needs assistance Equipment used: Rolling walker (2 wheeled) Transfers: Sit to/from Stand Sit to Stand: Supervision         General transfer comment: cues for position of LLE.  Ambulation/Gait Ambulation/Gait assistance: Supervision Ambulation Distance (Feet): 150 Feet Assistive device: Rolling walker (2 wheeled) Gait Pattern/deviations: Step-to pattern;Step-through pattern;Decreased step length - right;Decreased stance time - left;Antalgic Gait velocity: decreased   General Gait Details: Ambulating generally well. Verbal cues for left glute squeeze and quad activation in left stance  phase. Gait training focusing in step-through pattern.  Stairs Stairs: Yes Stairs assistance: Supervision Stair Management: One rail Left;Step to pattern;Sideways Number of Stairs: 2 (x4) General stair comments: Cues for sequencing and had pt teach-back correct technique. Demonstrates safe understanding and has no further questions concerning this task. Pt states he feels confident in his ability to navigate steps. reports he will wait for HHPT to train for steps within household but feels safe as far as ascend/descending steps outside of house to enter/exit.  Wheelchair Mobility    Modified Rankin (Stroke Patients Only)       Balance Overall balance assessment: Needs assistance Sitting-balance support: No upper extremity supported;Feet supported Sitting balance-Leahy Scale: Good Sitting balance - Comments: good limits of stability in sitting   Standing balance support: No upper extremity supported;During functional activity Standing balance-Leahy Scale: Fair Standing balance comment: able to reach for rail on steps without UE support                             Pertinent Vitals/Pain Pt states pain 1/10 Patient repositioned in chair for comfort.     Home Living Family/patient expects to be discharged to:: Private residence Living Arrangements: Spouse/significant other Available Help at Discharge: Family;Available 24 hours/day Type of Home: House Home Access: Stairs to enter Entrance Stairs-Rails: Left Entrance Stairs-Number of Steps: 4 Home Layout: Two level;Able to live on main level with bedroom/bathroom Home Equipment: Dan Humphreys - 2 wheels;Bedside commode;Hand held shower head;Adaptive equipment      Prior Function Level of Independence: Independent with assistive device(s)  Comments: used RW prior to surgery     Hand Dominance   Dominant Hand: Right    Extremity/Trunk Assessment   Upper Extremity Assessment: Overall WFL for tasks  assessed           Lower Extremity Assessment: Defer to PT evaluation   LLE Deficits / Details: decreased strength and ROM     Communication   Communication: No difficulties  Cognition Arousal/Alertness: Awake/alert Behavior During Therapy: WFL for tasks assessed/performed Overall Cognitive Status: Within Functional Limits for tasks assessed                      General Comments General comments (skin integrity, edema, etc.): reviewed posterior hip precautions and safety with mobility. Educated on home exercises and discussed d/c concerns    Exercises Total Joint Exercises Ankle Circles/Pumps: AROM;Both;10 reps;Seated Quad Sets: AROM;Both;10 reps;Seated Gluteal Sets: Strengthening;Both;10 reps;Seated Long Arc Quad: AROM;Left;10 reps;Seated      Assessment/Plan    PT Assessment Patient needs continued PT services  PT Diagnosis Difficulty walking;Abnormality of gait;Acute pain   PT Problem List Decreased strength;Decreased range of motion;Decreased activity tolerance;Decreased balance;Decreased mobility;Decreased knowledge of use of DME;Decreased knowledge of precautions;Pain  PT Treatment Interventions DME instruction;Gait training;Stair training;Functional mobility training;Therapeutic activities;Therapeutic exercise;Balance training;Neuromuscular re-education;Patient/family education;Modalities   PT Goals (Current goals can be found in the Care Plan section) Acute Rehab PT Goals Patient Stated Goal: go home PT Goal Formulation: With patient Time For Goal Achievement: 05/12/14 Potential to Achieve Goals: Good    Frequency 7X/week   Barriers to discharge        Co-evaluation               End of Session Equipment Utilized During Treatment: Gait belt Activity Tolerance: Patient tolerated treatment well Patient left: in chair;with call bell/phone within reach Nurse Communication: Mobility status         Time: [id number] PT Time Calculation  (min): 23 min   Charges:   PT Evaluation $Initial PT Evaluation Tier I: 1 Procedure PT Treatments $Gait Training: 8-22 mins   PT G CodesSunday Spillers:         Payden Bonus [place], [place] [phone]  [place]erton MountLogan S Barrett Goldie 05/05/2014, 1:32 PM

## 2014-05-05 NOTE — Progress Notes (Signed)
   PATIENT ID: Alcide Evener   1 Day Post-Op Procedure(s) (LRB): TOTAL HIP ARTHROPLASTY (Left) CORTISONE INJECTION LEFT KNEE (Left)  Subjective: Reports feeling great this am. Ready to work with PT. He tells be his preop groin pain is gone and he is happy about his progress so far. Hoping to go home today.   Objective:  Filed Vitals:   05/05/14 0815  BP:   Pulse:   Temp:   Resp: 16     Awake, alert, orientated L hip dressing c/d/i Wiggles toes, distally NVI Calf soft, nontender  Labs:   Recent Labs  05/05/14 0540  HGB 15.1   Recent Labs  05/05/14 0540  WBC 19.2*  RBC 4.66  HCT 43.4  PLT 295   Recent Labs  05/05/14 0540  NA 136*  K 4.7  CL 99  CO2 26  BUN 8  CREATININE 0.70  GLUCOSE 184*  CALCIUM 9.4    Assessment and Plan: 1 day s/p THA Up with PT today, WBAT Home today pending PT, if not d/c home tmr pending PT, nursing please call if cleared to go home today HHPT ordered  Scripts in chart  VTE proph: ASA 325mg  BID, SCDs

## 2014-05-05 NOTE — Care Management Note (Signed)
    Page 1 of 1   05/05/2014     4:38:56 PM CARE MANAGEMENT NOTE 05/05/2014  Patient:  Mathew Vega, Mathew Vega   Account Number:  [id number]  Date Initiated:  05/05/2014  Documentation initiated by:  [facility]  Subjective/Objective Assessment:   adm:  left hip pain; TOTAL HIP ARTHROPLASTY (Left)  CORTISONE INJECTION LEFT KNEE (Left)     Action/Plan:   discharge planning   Anticipated DC Date:  05/05/2014   Anticipated DC Plan:  [place]  CM consult      [facility] Choice  HOME HEALTH   Choice offered to / List presented to:  C-1 Patient        [place] arranged  HH-1 RN  [place].   Status of service:  Completed, signed off Medicare Important Message given?   (If response is "NO", the following Medicare IM given date fields will be blank) Date Medicare IM given:   Date Additional Medicare IM given:    Discharge Disposition:  [place]  Per UR Regulation:    If discussed at Long Length of Stay Meetings, dates discussed:    Comments:  05/05/14 13:45 CM met with pt in room to offer choice for HHPT/OT/RN.  Pt had [facility] before and requested Kathrynn Speed, PT with [facility] for this post hospitalization.  Pt has a 3n1 and Rolling Walker from a previous surgery and dos not need any other DME.  Address and contact information verified with pt.  Referral called to [facility] rep with request for [facility].  Pt understands the request was made but does not guarantee specific physical therapist.  No other CM needs were communicated.  Mariane Masters, BSN, CM [phone].

## 2014-05-05 NOTE — Evaluation (Signed)
Occupational Therapy Evaluation Patient Details Name: Mathew Vega MRN: [id number] DOB: [birth year] Today's Date: 05/05/2014    History of Present Illness 48 y.o. male s/p left THA.   Clinical Impression   Pt s/p above. Education provided during session and feel pt is safe to d/c home, from OT standpoint, with family available to assist.     Follow Up Recommendations  No OT follow up;Supervision - Intermittent    Equipment Recommendations  Other (comment) (AE)    Recommendations for Other Services       Precautions / Restrictions Precautions Precautions: Posterior Hip Precaution Booklet Issued:  (One in room) Precaution Comments: Reviewed precautions. Pt able to state 3/3 at end of session Restrictions Weight Bearing Restrictions: Yes LLE Weight Bearing: Weight bearing as tolerated      Mobility Bed Mobility               General bed mobility comments: pt in chair  Transfers Overall transfer level: Needs assistance   Transfers: Sit to/from Stand Sit to Stand: Supervision         General transfer comment: cues for position of LLE.    Balance                                            ADL Overall ADL's : Needs assistance/impaired                     Lower Body Dressing: Minimal assistance;Sit to/from stand;With adaptive equipment   Toilet Transfer: Supervision/safety;Ambulation;RW;BSC       Tub/ Shower Transfer: Min guard;Ambulation;Rolling walker;3 in 1   Functional mobility during ADLs: Supervision/safety;Min guard;Rolling walker (Min guard for shower transfer) General ADL Comments: Educated on dressing technique and recommended sitting for most of bathing/dressing. Recommended spouse be with him for tub or shower transfer. Educated on use of bag on walker and discussed rugs and safe shoewear. Educated on AE for LB ADLs. Practiced shower transfer. OT educated on both tub/shower techniques and shower transfer technique.      Vision                     Perception     Praxis      Pertinent Vitals/Pain Pain 5-6/10. Increased activity during session. Nurse brought meds during session.      Hand Dominance Right   Extremity/Trunk Assessment Upper Extremity Assessment Upper Extremity Assessment: Overall WFL for tasks assessed   Lower Extremity Assessment Lower Extremity Assessment: Defer to PT evaluation       Communication Communication Communication: No difficulties   Cognition Arousal/Alertness: Awake/alert Behavior During Therapy: WFL for tasks assessed/performed Overall Cognitive Status: Within Functional Limits for tasks assessed                     General Comments       Exercises       Shoulder Instructions      Home Living Family/patient expects to be discharged to:: Private residence Living Arrangements: Spouse/significant other Available Help at Discharge: Family;Available 24 hours/day Type of Home: House Home Access: Stairs to enter [employer] of Steps: 4 Entrance Stairs-Rails: Left Home Layout: Two level;Able to live on main level with bedroom/bathroom     Bathroom Shower/Tub: Tub/shower unit;Walk-in shower   Bathroom Toilet:  [facility])     Home Equipment: Dan Humphreys - 2 wheels;Bedside commode;Hand  held shower head;Adaptive equipment Adaptive Equipment: Reacher;Other (Comment) (leg lifter)        Prior Functioning/Environment Level of Independence: Independent with assistive device(s)        Comments: used RW prior to surgery    OT Diagnosis:     OT Problem List:     OT Treatment/Interventions:      OT Goals(Current goals can be found in the care plan section)    OT Frequency:     Barriers to D/C:            Co-evaluation              End of Session Equipment Utilized During Treatment: Gait belt;Rolling walker  Activity Tolerance: Patient tolerated treatment well Patient left: in chair;with call bell/phone within  reach   Time: 1146-1209 OT Time Calculation (min): 23 min Charges:  OT General Charges $OT Visit: 1 Procedure OT Evaluation $Initial OT Evaluation Tier I: 1 Procedure OT Treatments $Self Care/Home Management : 8-22 mins G-Codes:    Earlie RavelingLindsey L Makalah Asberry OTR/L [phone] 05/05/2014, 12:31 PM

## 2014-05-06 NOTE — Discharge Summary (Signed)
Patient ID: Mathew Vega MRN: [id number] DOB/AGE: [birth year] 48 y.o.  Admit date: 05/04/2014 Discharge date: 05/05/2014   Admission Diagnoses:  Principal Problem:   Arthritis of left hip   Discharge Diagnoses:  Same  Past Medical History  Diagnosis Date  . Arthritis     Surgeries: Procedure(s): TOTAL HIP ARTHROPLASTY CORTISONE INJECTION LEFT KNEE on 05/04/2014   Consultants:    Discharged Condition: Improved  Hospital Course: Mathew Vega is an 48 y.o. male who was admitted 05/04/2014 for operative treatment ofArthritis of left hip. Has a history of pain and functional disability in the left hip(s) due to arthritis and patient has failed non-surgical conservative treatments for greater than 12 weeks to include NSAID's and/or analgesics, corticosteriod injections, weight reduction as appropriate and activity modification. Onset of symptoms was gradual starting 7 years ago with gradually worsening course since that time.  Patient has severe unremitting pain that affects sleep, daily activities, and work/hobbies. After pre-op clearance the patient was taken to the operating room on 05/04/2014 and underwent  Procedure(s): TOTAL HIP ARTHROPLASTY CORTISONE INJECTION LEFT KNEE.    Patient was given perioperative antibiotics: Anti-infectives   Start     Dose/Rate Route Frequency Ordered Stop   05/04/14 0600  ceFAZolin (ANCEF) IVPB 2 g/50 mL premix     2 g 100 mL/hr over 30 Minutes Intravenous On call to O.R. 05/03/14 1410 05/04/14 1307       Patient was given sequential compression devices, early ambulation, and ASA 325mg  BID to prevent DVT.  Patient benefited maximally from hospital stay and there were no complications.    Recent vital signs: Patient Vitals for the past 24 hrs:  Resp SpO2  05/05/14 1207 16 98 %     Recent laboratory studies:  Recent Labs  05/05/14 0540  WBC 19.2*  HGB 15.1  HCT 43.4  PLT 295  NA 136*  K 4.7  CL 99  CO2 26  BUN 8  CREATININE  0.70  GLUCOSE 184*  CALCIUM 9.4     Discharge Medications:     Medication List    STOP taking these medications       HYDROcodone-acetaminophen 7.5-325 MG per tablet  Commonly known as:  NORCO      TAKE these medications       ADDERALL XR 20 MG 24 hr capsule  Generic drug:  amphetamine-dextroamphetamine  Take 20 mg by mouth daily.     ADDERALL 10 MG tablet  Generic drug:  amphetamine-dextroamphetamine  Take 10 mg by mouth daily with breakfast.     aspirin EC 325 MG tablet  Take 1 tablet (325 mg total) by mouth 2 (two) times daily.     methocarbamol 500 MG tablet  Commonly known as:  ROBAXIN  Take 1 tablet (500 mg total) by mouth 2 (two) times daily with a meal.     oxyCODONE-acetaminophen 5-325 MG per tablet  Commonly known as:  ROXICET  Take 1 tablet by mouth every 4 (four) hours as needed.        Diagnostic Studies: Dg Chest 2 View  04/20/2014   CLINICAL DATA:  Preoperative chest radiograph for left hip arthroplasty.  EXAM: CHEST  2 VIEW  COMPARISON:  None.  FINDINGS: Cardiopericardial silhouette within normal limits. Mediastinal contours normal. Trachea midline. No airspace disease or effusion.  IMPRESSION: No active cardiopulmonary disease.   Electronically Signed   By: Andreas Newport M.D.   On: 04/20/2014 09:52   Dg Pelvis Portable  05/04/2014  CLINICAL DATA:  Postop  EXAM: PORTABLE PELVIS 1-2 VIEWS  COMPARISON:  None.  FINDINGS: Left total hip arthroplasty is in place. No breakage or loosening of the hardware. Anatomic alignment.  IMPRESSION: Left total hip arthroplasty anatomically aligned.   Electronically Signed   By: Maryclare BeanArt  Hoss M.D.   On: 05/04/2014 16:14    Disposition: 01-Home or Self Care      Discharge Instructions   Call MD / Call 911    Complete by:  As directed   If you experience chest pain or shortness of breath, CALL 911 and be transported to the hospital emergency room.  If you develope a fever above 101 F, pus (white drainage) or increased  drainage or redness at the wound, or calf pain, call your surgeon's office.     Constipation Prevention    Complete by:  As directed   Drink plenty of fluids.  Prune juice may be helpful.  You may use a stool softener, such as Colace (over the counter) 100 mg twice a day.  Use MiraLax (over the counter) for constipation as needed.     Diet - low sodium heart healthy    Complete by:  As directed      Increase activity slowly as tolerated    Complete by:  As directed            Follow-up Information   Follow up with Nestor LewandowskyOWAN,FRANK J, MD In 2 weeks.   Specialty:  Orthopedic Surgery   Contact information:   [address] [place] [id number] [phone]        Signed: Jiles HaroldDanielle Ednah Hammock 05/06/2014, 9:10 AM

## 2014-05-07 ENCOUNTER — Encounter (HOSPITAL_COMMUNITY): Payer: Self-pay | Admitting: Orthopedic Surgery

## 2015-04-29 IMAGING — CR DG CHEST 2V
2 series · 2 of 2 positions shown · non-contrast
Comparison: None.

CLINICAL DATA: Preoperative chest radiograph for left hip
arthroplasty.

EXAM:
CHEST  2 VIEW

[w chest pa]
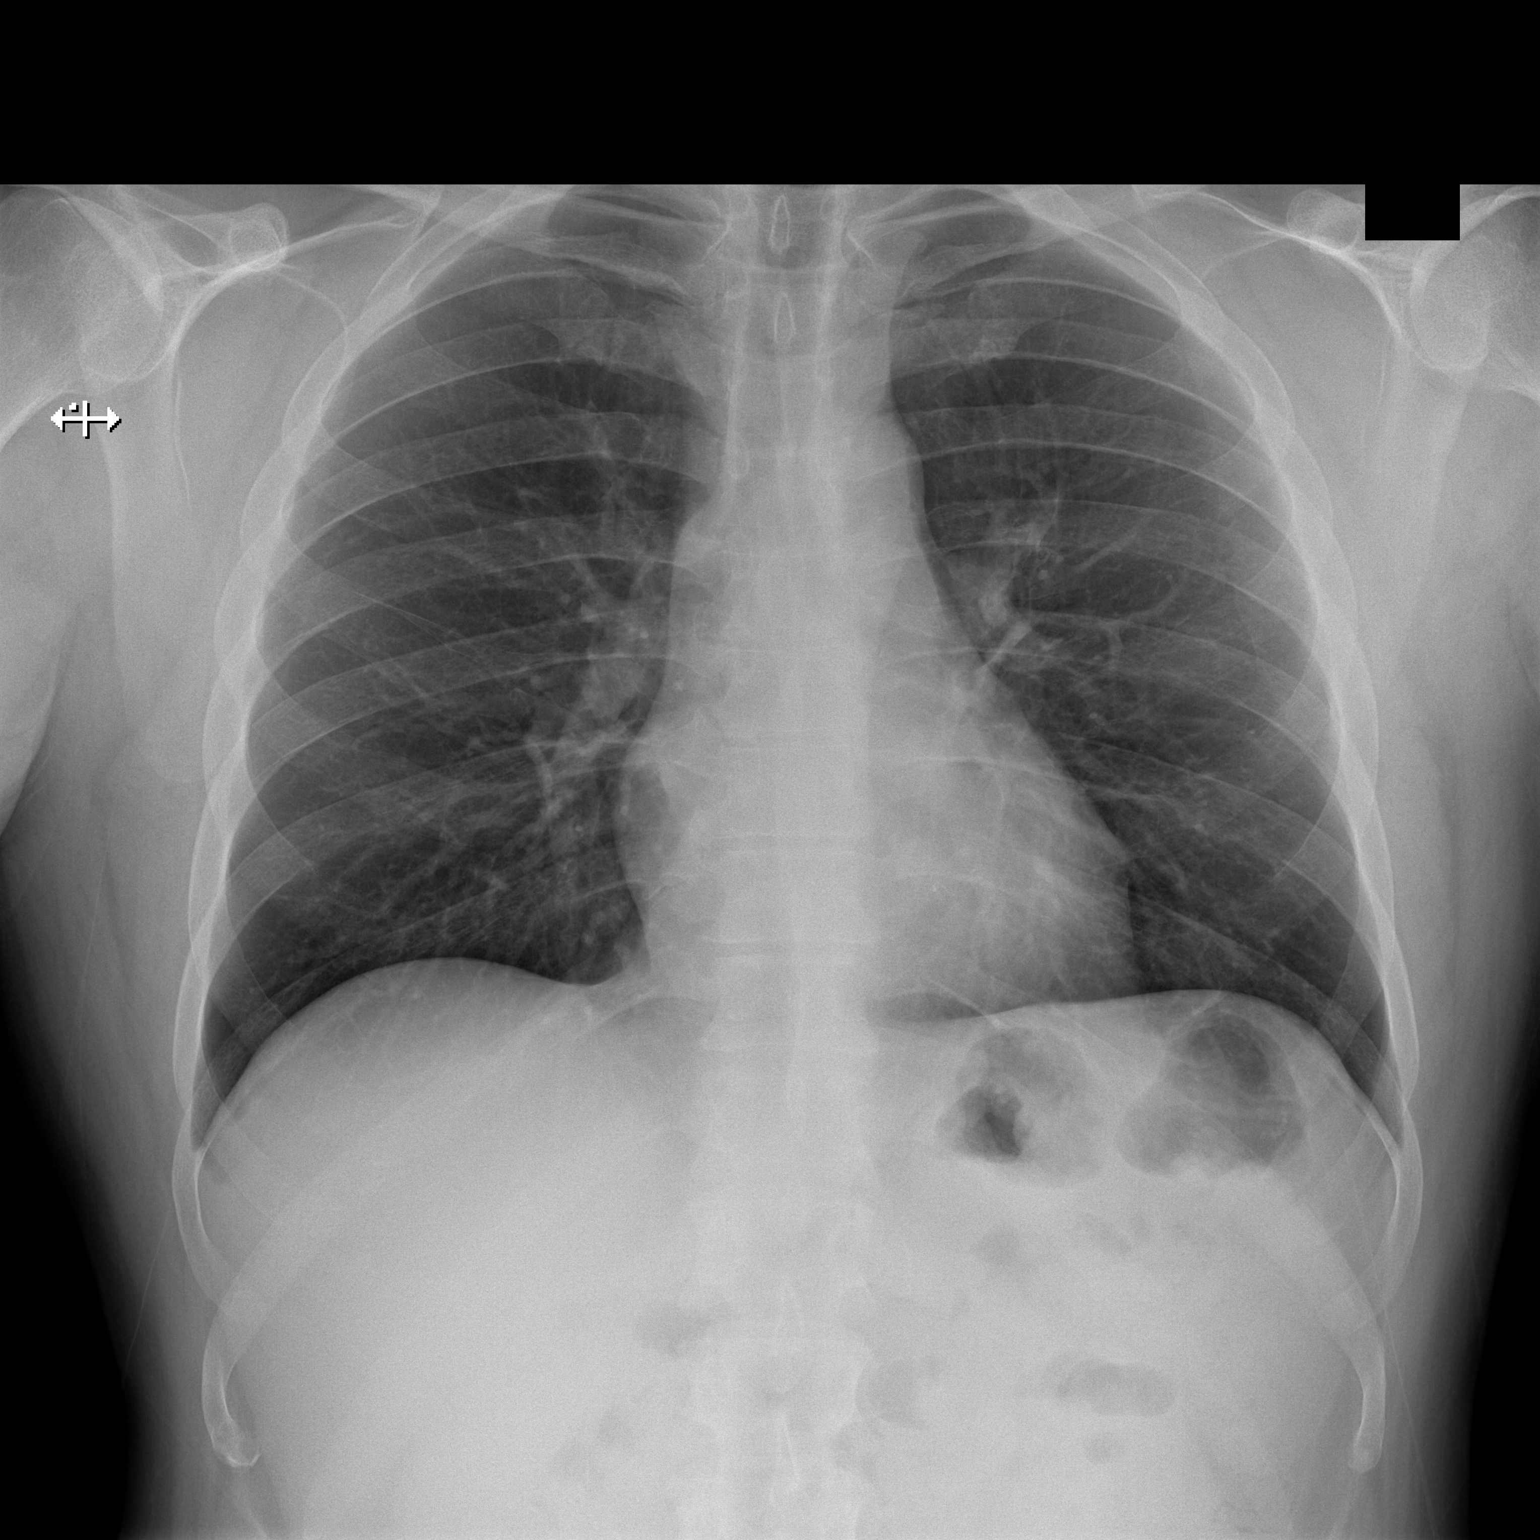

[w chest lat]
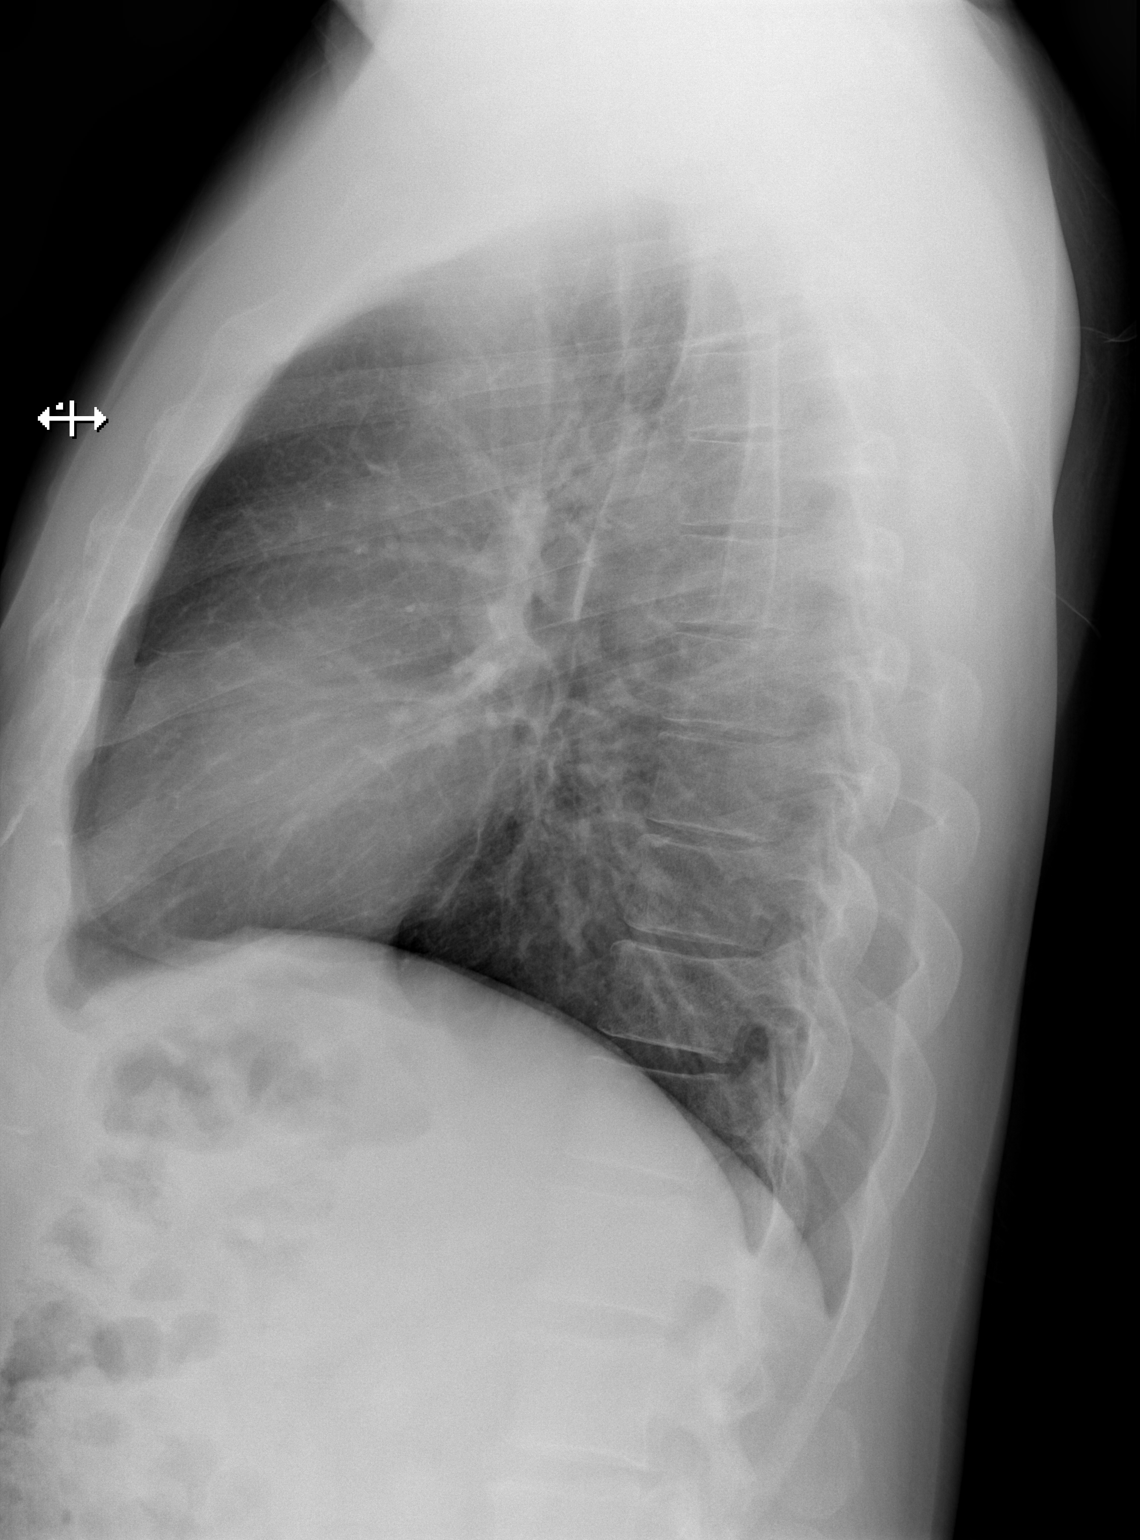

[2 of 2 positions shown; findings below may reference images not displayed]

FINDINGS: Cardiopericardial silhouette within normal limits. Mediastinal
contours normal. Trachea midline. No airspace disease or effusion.
IMPRESSION: No active cardiopulmonary disease.

## 2015-05-13 IMAGING — CR DG PORTABLE PELVIS
2 series · 2 of 2 positions shown · non-contrast
Comparison: None.

CLINICAL DATA: Postop

EXAM:
PORTABLE PELVIS 1-2 VIEWS

[AP (1 of 2)]
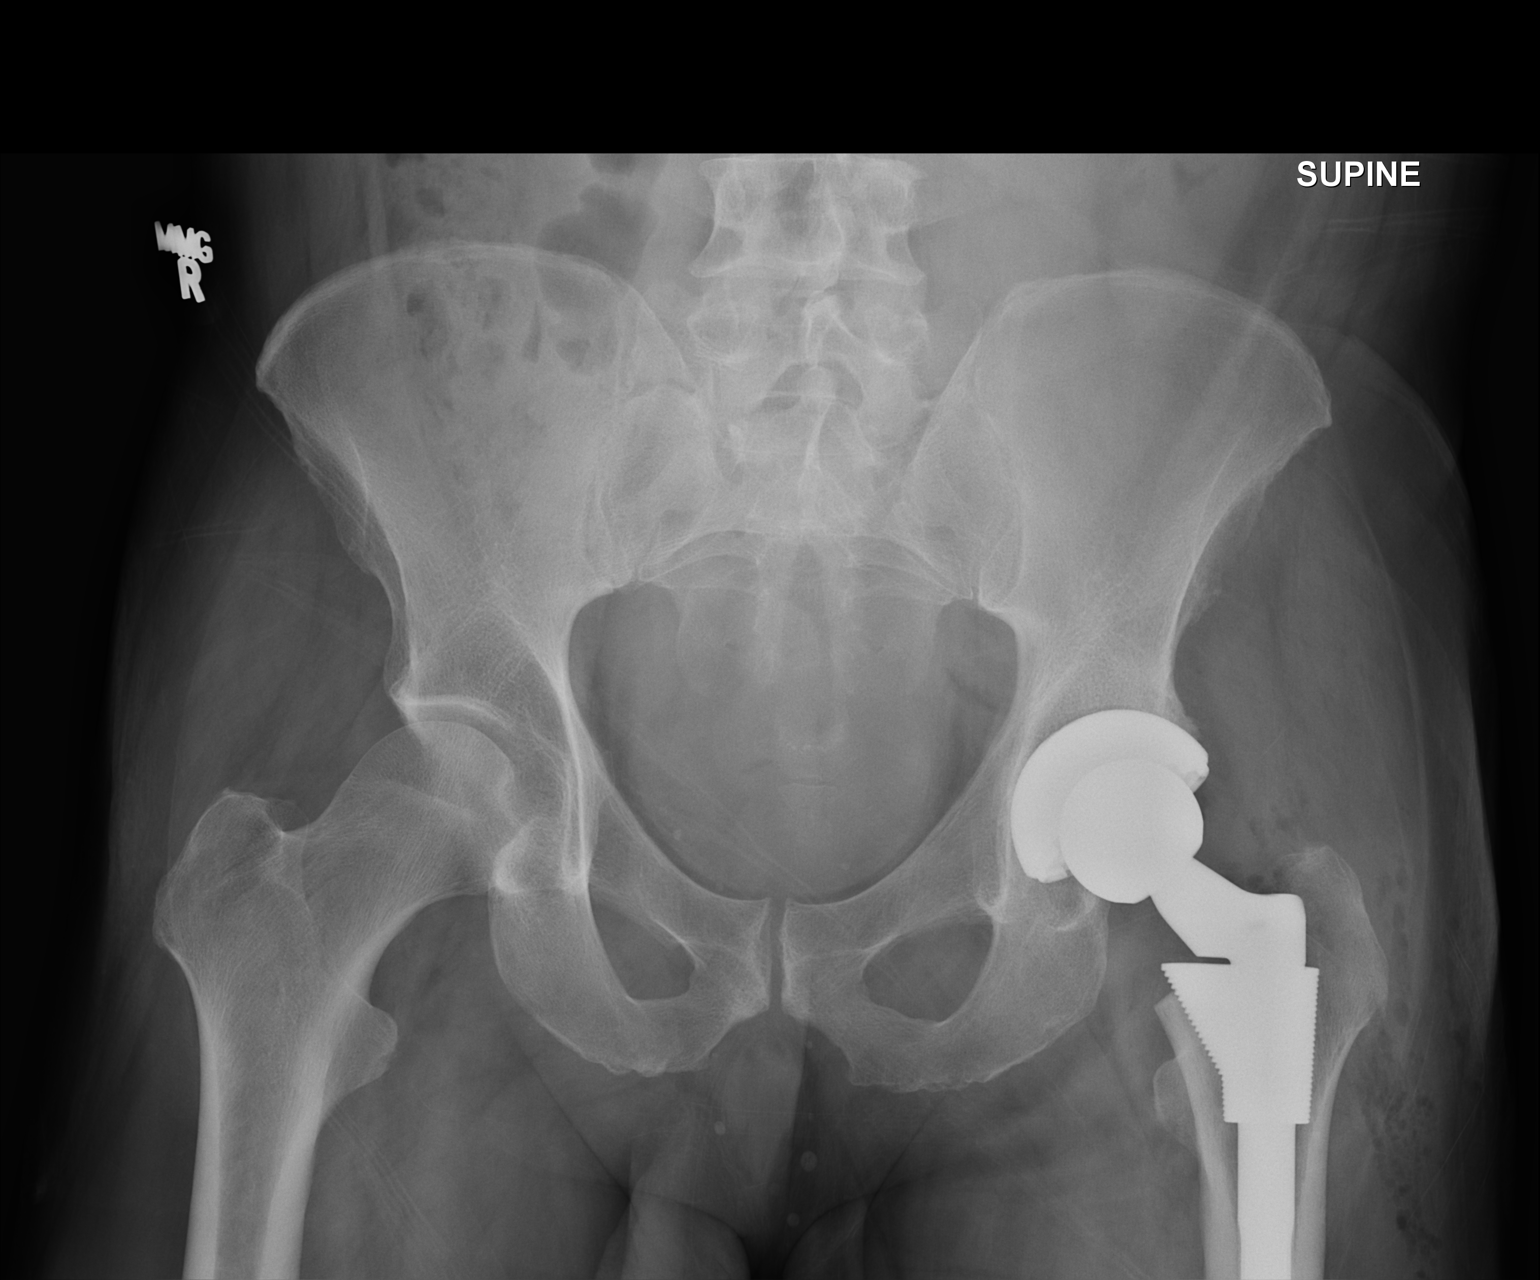

[AP (2 of 2)]
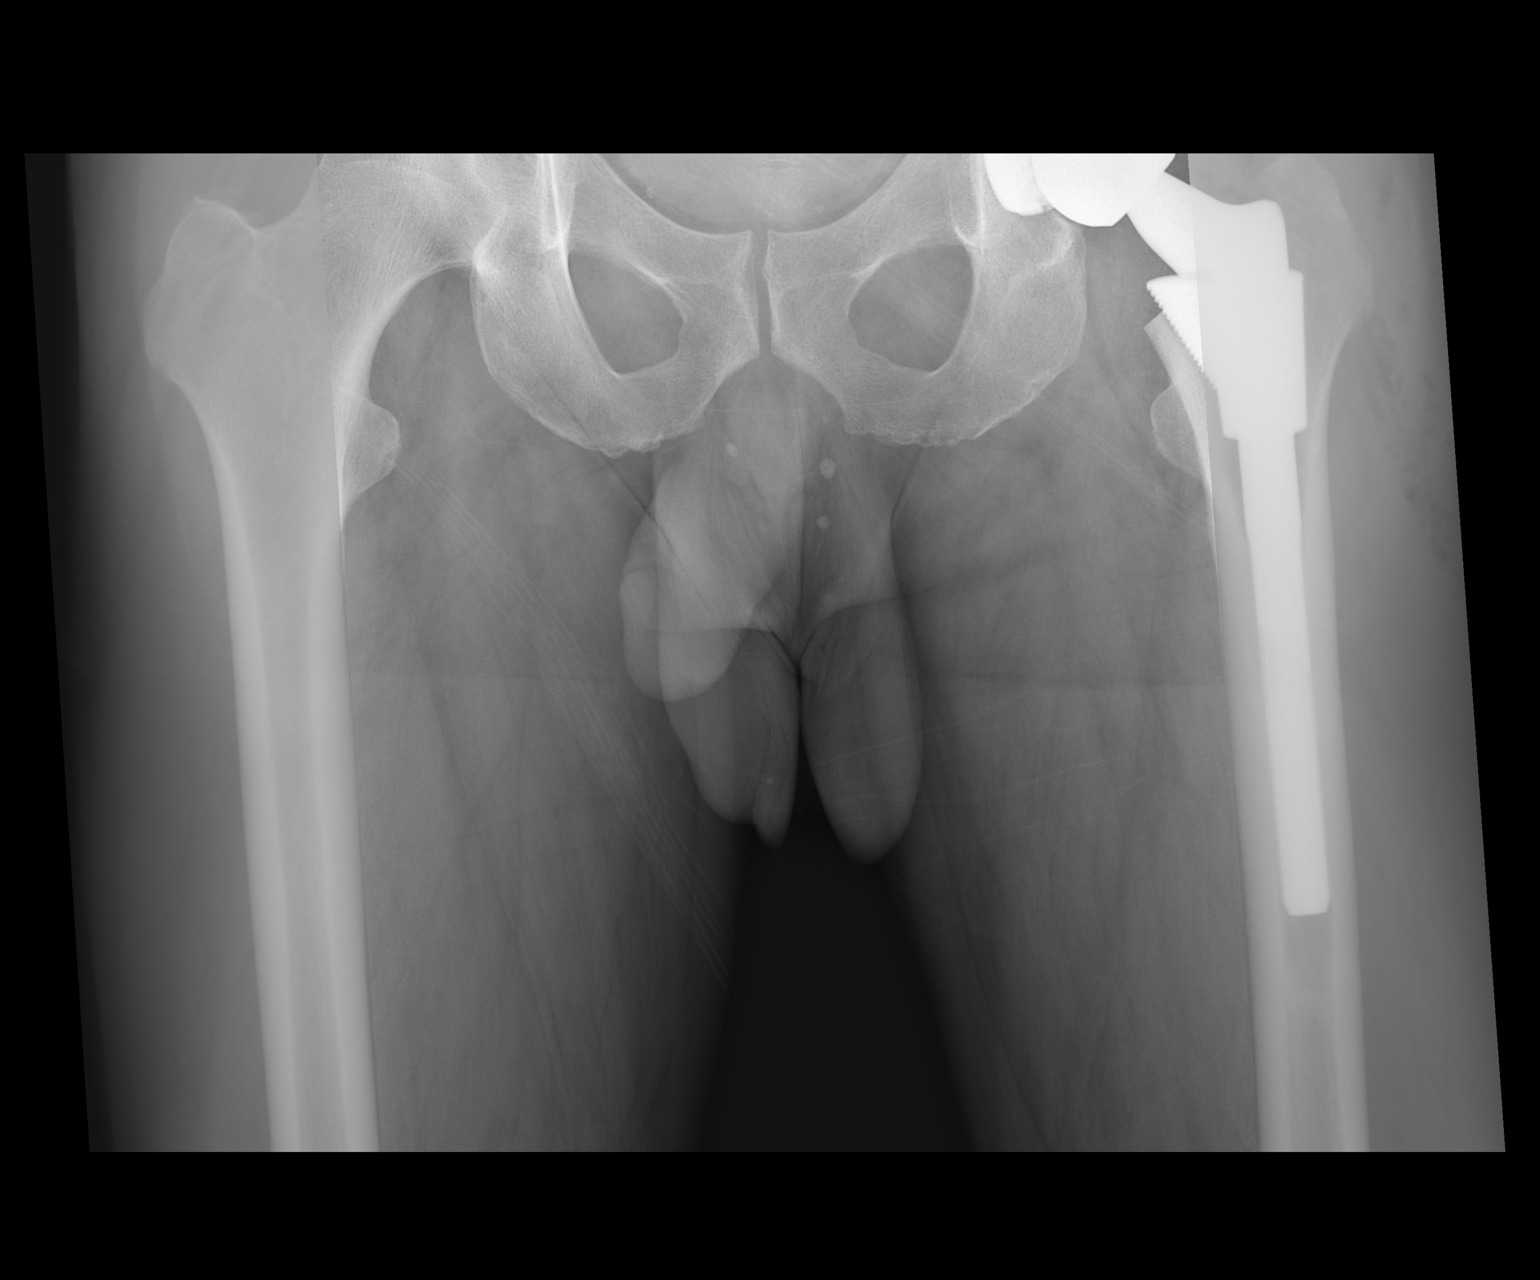

[2 of 2 positions shown; findings below may reference images not displayed]

FINDINGS: Left total hip arthroplasty is in place. No breakage or loosening of
the hardware. Anatomic alignment.
IMPRESSION: Left total hip arthroplasty anatomically aligned.

## 2019-12-08 HISTORY — PX: COLONOSCOPY: SHX174

## 2022-10-14 ENCOUNTER — Other Ambulatory Visit: Payer: Self-pay | Admitting: Orthopedic Surgery

## 2022-11-11 ENCOUNTER — Inpatient Hospital Stay (HOSPITAL_COMMUNITY): Admission: RE | Admit: 2022-11-11 | Payer: Self-pay | Source: Ambulatory Visit

## 2022-11-13 NOTE — Pre-Procedure Instructions (Signed)
Surgical Instructions    Your procedure is scheduled on Thursday, November 19, 2022.  Report to [facility] Main Entrance "A" at 7:49 A.M., then check in with the Admitting office.  Call this number if you have problems the morning of surgery:  [phone]   If you have any questions prior to your surgery date call [phone]: Open Monday-Friday 8am-4pm If you experience any cold or flu symptoms such as cough, fever, chills, shortness of breath, etc. between now and your scheduled surgery, please notify us at the above number     Remember:  Do not eat after midnight the night before your surgery  You may drink clear liquids until 7:49 am the morning of your surgery.   Clear liquids allowed are: Water, Non-Citrus Juices (without pulp), Carbonated Beverages, Clear Tea, Black Coffee ONLY (NO MILK, CREAM OR POWDERED CREAMER of any kind), and Gatorade    Enhanced Recovery after Surgery  Enhanced Recovery after Surgery is a protocol used to improve the stress on your body and your recovery after surgery.  Patient Instructions  The day of surgery (if you do NOT have diabetes):  Drink ONE (1) Pre-Surgery Clear Ensure by  7:49 am the morning of surgery   This drink was given to you during your hospital  pre-op appointment visit. Nothing else to drink after completing the  Pre-Surgery Clear Ensure.         If you have questions, please contact your surgeon's office.    Take these medicines the morning of surgery with A SIP OF WATER:  tetrahydrozoline 0.05 % ophthalmic solution   traMADol (ULTRAM)-as needed   As of today, STOP taking any Aspirin (unless otherwise instructed by your surgeon) Aleve, Naproxen, Ibuprofen, Motrin, Advil, Goody's, BC's, all herbal medications, fish oil, and all vitamins.           Do not wear jewelry. Do not wear lotions, powders,cologne or deodorant. Do not shave 48 hours prior to surgery.  Men may shave face and neck. Do not bring valuables to the  hospital. Do not wear nail polish, gel polish, artificial nails, or any other type of covering on natural nails (fingers and toes)  [facility] is not responsible for any belongings or valuables.    Do NOT Smoke (Tobacco/Vaping)  24 hours prior to your procedure  If you use a CPAP at night, you may bring your mask for your overnight stay.   Contacts, glasses, hearing aids, dentures or partials may not be worn into surgery, please bring cases for these belongings   For patients admitted to the hospital, discharge time will be determined by your treatment team.   Patients discharged the day of surgery will not be allowed to drive home, and someone needs to stay with them for 24 hours.   SURGICAL WAITING ROOM VISITATION Patients having surgery or a procedure may have no more than 2 support people in the waiting area - these visitors may rotate.   Children under the age of 57 must have an adult with them who is not the patient. If the patient needs to stay at the hospital during part of their recovery, the visitor guidelines for inpatient rooms apply. Pre-op nurse will coordinate an appropriate time for 1 support person to accompany patient in pre-op.  This support person may not rotate.   Please refer to [email or web address] for the visitor guidelines for Inpatients (after your surgery is over and you are in a regular room).    Special instructions:  Oral Hygiene is also important to reduce your risk of infection.  Remember - BRUSH YOUR TEETH THE MORNING OF SURGERY WITH YOUR REGULAR TOOTHPASTE   [place]- Preparing For Surgery  Before surgery, you can play an important role. Because skin is not sterile, your skin needs to be as free of germs as possible. You can reduce the number of germs on your skin by washing with CHG (chlorahexidine gluconate) Soap before surgery.  CHG is an antiseptic cleaner which kills germs and bonds with the  skin to continue killing germs even after washing.     Please do not use if you have an allergy to CHG or antibacterial soaps. If your skin becomes reddened/irritated stop using the CHG.  Do not shave (including legs and underarms) for at least 48 hours prior to first CHG shower. It is OK to shave your face.  Please follow these instructions carefully.     Shower the NIGHT BEFORE SURGERY and the MORNING OF SURGERY with CHG Soap.   If you chose to wash your hair, wash your hair first as usual with your normal shampoo. After you shampoo, rinse your hair and body thoroughly to remove the shampoo.  Then [employer] and genitals (private parts) with your normal soap and rinse thoroughly to remove soap.  After that Use CHG Soap as you would any other liquid soap. You can apply CHG directly to the skin and wash gently with a scrungie or a clean washcloth.   Apply the CHG Soap to your body ONLY FROM THE NECK DOWN.  Do not use on open wounds or open sores. Avoid contact with your eyes, ears, mouth and genitals (private parts). Wash Face and genitals (private parts)  with your normal soap.   Wash thoroughly, paying special attention to the area where your surgery will be performed.  Thoroughly rinse your body with warm water from the neck down.  DO NOT shower/wash with your normal soap after using and rinsing off the CHG Soap.  Pat yourself dry with a CLEAN TOWEL.  Wear CLEAN PAJAMAS to bed the night before surgery  Place CLEAN SHEETS on your bed the night before your surgery  DO NOT SLEEP WITH PETS.   Day of Surgery:  Take a shower with CHG soap. Wear Clean/Comfortable clothing the morning of surgery Do not apply any deodorants/lotions.   Remember to brush your teeth WITH YOUR REGULAR TOOTHPASTE.    If you received a COVID test during your pre-op visit, it is requested that you wear a mask when out in public, stay away from anyone that may not be feeling well, and notify your surgeon if  you develop symptoms. If you have been in contact with anyone that has tested positive in the last 10 days, please notify your surgeon.    Please read over the following fact sheets that you were given.

## 2022-11-16 ENCOUNTER — Encounter (HOSPITAL_COMMUNITY)
Admission: RE | Admit: 2022-11-16 | Discharge: 2022-11-16 | Disposition: A | Discharge status: Home / Self Care | Payer: BC Managed Care – PPO | Source: Ambulatory Visit | Attending: Orthopedic Surgery | Admitting: Orthopedic Surgery

## 2022-11-16 ENCOUNTER — Other Ambulatory Visit: Payer: Self-pay

## 2022-11-16 ENCOUNTER — Encounter (HOSPITAL_COMMUNITY): Payer: Self-pay

## 2022-11-16 VITALS — BP 138/91 | HR 67 | Temp 97.5°F | Resp 18 | Ht 71.0 in | Wt 192.2 lb

## 2022-11-16 DIAGNOSIS — Z01812 Encounter for preprocedural laboratory examination: Secondary | ICD-10-CM | POA: Diagnosis not present

## 2022-11-16 DIAGNOSIS — Z01818 Encounter for other preprocedural examination: Secondary | ICD-10-CM

## 2022-11-16 LAB — TYPE AND SCREEN
ABO/RH(D): B POS
Antibody Screen: NEGATIVE

## 2022-11-16 LAB — SURGICAL PCR SCREEN
MRSA, PCR: NEGATIVE
Staphylococcus aureus: POSITIVE — AB

## 2022-11-16 LAB — CBC
HCT: 48.7 % (ref 39.0–52.0)
Hemoglobin: 16.7 g/dL (ref 13.0–17.0)
MCH: 32.1 pg (ref 26.0–34.0)
MCHC: 34.3 g/dL (ref 30.0–36.0)
MCV: 93.7 fL (ref 80.0–100.0)
Platelets: 246 10*3/uL (ref 150–400)
RBC: 5.2 MIL/uL (ref 4.22–5.81)
RDW: 12.6 % (ref 11.5–15.5)
WBC: 11.5 10*3/uL — ABNORMAL HIGH (ref 4.0–10.5)
nRBC: 0 % (ref 0.0–0.2)

## 2022-11-16 NOTE — Progress Notes (Signed)
PCP - [facility] Cardiologist - Denies  PPM/ICD - Denies Device Orders - n/a Rep Notified - n/a  Chest x-ray - n/a EKG - Denies Stress Test - Per pt, had one around 2009 that a company paid for. Result was normal  ECHO - Denies Cardiac Cath - Denies  Sleep Study - Denies CPAP - n/a  No DM  Last dose of GLP1 agonist- n/a GLP1 instructions: n/a  Blood Thinner Instructions: n/a Aspirin Instructions: n/a  ERAS Protcol - Clear liquids until 0430 morning of surgery PRE-SURGERY Ensure or G2- Ensure given to patient at PAT appointment  COVID TEST- n/a   Anesthesia review: No.   Patient denies shortness of breath, fever, cough and chest pain at PAT appointment   All instructions explained to the patient, with a verbal understanding of the material. Patient agrees to go over the instructions while at home for a better understanding. Patient also instructed to self quarantine after being tested for COVID-19. The opportunity to ask questions was provided.

## 2022-11-16 NOTE — Pre-Procedure Instructions (Signed)
Surgical Instructions    Your procedure is scheduled on November 19, 2022.  Report to [facility] Main Entrance "A" at 5:30 A.M., then check in with the Admitting office.  Call this number if you have problems the morning of surgery:  [phone]   If you have any questions prior to your surgery date call [phone]: Open Monday-Friday 8am-4pm If you experience any cold or flu symptoms such as cough, fever, chills, shortness of breath, etc. between now and your scheduled surgery, please notify us at the above number     Remember:  Do not eat after midnight the night before your surgery  You may drink clear liquids until 4:30 AM the morning of your surgery.   Clear liquids allowed are: Water, Non-Citrus Juices (without pulp), Carbonated Beverages, Clear Tea, Black Coffee Only (NO MILK, CREAM OR POWDERED CREAMER of any kind), and Gatorade.     Take these medicines the morning of surgery with A SIP OF WATER:  tetrahydrozoline 0.05 % ophthalmic solution   traMADol (ULTRAM) - may take if needed   As of today, STOP taking any Aspirin (unless otherwise instructed by your surgeon) Aleve, Naproxen, Ibuprofen, Motrin, Advil, Goody's, BC's, all herbal medications, fish oil, and all vitamins.                     Do NOT Smoke (Tobacco/Vaping) for 24 hours prior to your procedure.  If you use a CPAP at night, you may bring your mask/headgear for your overnight stay.   Contacts, glasses, piercing's, hearing aid's, dentures or partials may not be worn into surgery, please bring cases for these belongings.    For patients admitted to the hospital, discharge time will be determined by your treatment team.   Patients discharged the day of surgery will not be allowed to drive home, and someone needs to stay with them for 24 hours.  SURGICAL WAITING ROOM VISITATION Patients having surgery or a procedure may have no more than 2 support people in the waiting area - these visitors may rotate.    Children under the age of 49 must have an adult with them who is not the patient. If the patient needs to stay at the hospital during part of their recovery, the visitor guidelines for inpatient rooms apply. Pre-op nurse will coordinate an appropriate time for 1 support person to accompany patient in pre-op.  This support person may not rotate.   Please refer to the [facility] website for the visitor guidelines for Inpatients (after your surgery is over and you are in a regular room).    Special instructions:   [place]- Preparing For Surgery  Before surgery, you can play an important role. Because skin is not sterile, your skin needs to be as free of germs as possible. You can reduce the number of germs on your skin by washing with CHG (chlorahexidine gluconate) Soap before surgery.  CHG is an antiseptic cleaner which kills germs and bonds with the skin to continue killing germs even after washing.    Oral Hygiene is also important to reduce your risk of infection.  Remember - BRUSH YOUR TEETH THE MORNING OF SURGERY WITH YOUR REGULAR TOOTHPASTE  Please do not use if you have an allergy to CHG or antibacterial soaps. If your skin becomes reddened/irritated stop using the CHG.  Do not shave (including legs and underarms) for at least 48 hours prior to first CHG shower. It is OK to shave your face.  Please follow  these instructions carefully.   Shower the NIGHT BEFORE SURGERY and the MORNING OF SURGERY  If you chose to wash your hair, wash your hair first as usual with your normal shampoo.  After you shampoo, rinse your hair and body thoroughly to remove the shampoo.  Use CHG Soap as you would any other liquid soap. You can apply CHG directly to the skin and wash gently with a scrungie or a clean washcloth.   Apply the CHG Soap to your body ONLY FROM THE NECK DOWN.  Do not use on open wounds or open sores. Avoid contact with your eyes, ears, mouth and genitals (private parts). Wash Face  and genitals (private parts)  with your normal soap.   Wash thoroughly, paying special attention to the area where your surgery will be performed.  Thoroughly rinse your body with warm water from the neck down.  DO NOT shower/wash with your normal soap after using and rinsing off the CHG Soap.  Pat yourself dry with a CLEAN TOWEL.  Wear CLEAN PAJAMAS to bed the night before surgery  Place CLEAN SHEETS on your bed the night before your surgery  DO NOT SLEEP WITH PETS.   Day of Surgery: Take a shower with CHG soap. Do not wear jewelry or makeup Do not wear lotions, powders, perfumes/colognes, or deodorant. Do not shave 48 hours prior to surgery.  Men may shave face and neck. Do not bring valuables to the hospital.  [facility] is not responsible for any belongings or valuables. Do not wear nail polish, gel polish, artificial nails, or any other type of covering on natural nails (fingers and toes) If you have artificial nails or gel coating that need to be removed by a nail salon, please have this removed prior to surgery. Artificial nails or gel coating may interfere with anesthesia's ability to adequately monitor your vital signs. Wear Clean/Comfortable clothing the morning of surgery Remember to brush your teeth WITH YOUR REGULAR TOOTHPASTE.   Please read over the following fact sheets that you were given.    If you received a COVID test during your pre-op visit  it is requested that you wear a mask when out in public, stay away from anyone that may not be feeling well and notify your surgeon if you develop symptoms. If you have been in contact with anyone that has tested positive in the last 10 days please notify you surgeon.

## 2022-11-18 NOTE — Anesthesia Preprocedure Evaluation (Addendum)
Anesthesia Evaluation  Patient identified by MRN, date of birth, ID band Patient awake    Reviewed: Allergy & Precautions, H&P , NPO status , Patient's Chart, lab work & pertinent test results  Airway Mallampati: II  TM Distance: >3 FB Neck ROM: Full    Dental no notable dental hx. (+) Teeth Intact, Dental Advisory Given   Pulmonary neg pulmonary ROS   Pulmonary exam normal breath sounds clear to auscultation       Cardiovascular Exercise Tolerance: Good negative cardio ROS  Rhythm:Regular Rate:Normal     Neuro/Psych negative neurological ROS  negative psych ROS   GI/Hepatic negative GI ROS, Neg liver ROS,,,  Endo/Other  negative endocrine ROS    Renal/GU negative Renal ROS  negative genitourinary   Musculoskeletal  (+) Arthritis , Osteoarthritis,    Abdominal   Peds  Hematology negative hematology ROS (+)   Anesthesia Other Findings   Reproductive/Obstetrics negative OB ROS                             Anesthesia Physical Anesthesia Plan  ASA: 2  Anesthesia Plan: General   Post-op Pain Management: Tylenol PO (pre-op)* and Ketamine IV*   Induction: Intravenous  PONV Risk Score and Plan: 3 and Ondansetron, Dexamethasone and Midazolam  Airway Management Planned: Oral ETT  Additional Equipment:   Intra-op Plan:   Post-operative Plan: Extubation in OR  Informed Consent: I have reviewed the patients History and Physical, chart, labs and discussed the procedure including the risks, benefits and alternatives for the proposed anesthesia with the patient or authorized representative who has indicated his/her understanding and acceptance.     Dental advisory given  Plan Discussed with: CRNA  Anesthesia Plan Comments:        Anesthesia Quick Evaluation

## 2022-11-19 ENCOUNTER — Inpatient Hospital Stay (HOSPITAL_COMMUNITY): Payer: BC Managed Care – PPO

## 2022-11-19 ENCOUNTER — Ambulatory Visit (HOSPITAL_COMMUNITY)
Admission: RE | Disposition: A | Discharge status: Home / Self Care | Payer: Self-pay | Source: Home / Self Care | Attending: Orthopedic Surgery

## 2022-11-19 ENCOUNTER — Other Ambulatory Visit: Payer: Self-pay

## 2022-11-19 ENCOUNTER — Inpatient Hospital Stay (HOSPITAL_COMMUNITY): Payer: BC Managed Care – PPO | Admitting: Anesthesiology

## 2022-11-19 ENCOUNTER — Encounter (HOSPITAL_COMMUNITY): Payer: Self-pay | Admitting: Orthopedic Surgery

## 2022-11-19 ENCOUNTER — Inpatient Hospital Stay: Payer: Self-pay

## 2022-11-19 ENCOUNTER — Observation Stay (HOSPITAL_COMMUNITY)
Admission: RE | Admit: 2022-11-19 | Discharge: 2022-11-20 | Disposition: A | Discharge status: Home / Self Care | Payer: BC Managed Care – PPO | Attending: Orthopedic Surgery | Admitting: Orthopedic Surgery

## 2022-11-19 DIAGNOSIS — M5416 Radiculopathy, lumbar region: Secondary | ICD-10-CM | POA: Insufficient documentation

## 2022-11-19 DIAGNOSIS — M48061 Spinal stenosis, lumbar region without neurogenic claudication: Secondary | ICD-10-CM | POA: Diagnosis not present

## 2022-11-19 DIAGNOSIS — M5410 Radiculopathy, site unspecified: Principal | ICD-10-CM | POA: Diagnosis present

## 2022-11-19 DIAGNOSIS — M4316 Spondylolisthesis, lumbar region: Secondary | ICD-10-CM | POA: Diagnosis not present

## 2022-11-19 DIAGNOSIS — Z96642 Presence of left artificial hip joint: Secondary | ICD-10-CM | POA: Diagnosis not present

## 2022-11-19 HISTORY — PX: TRANSFORAMINAL LUMBAR INTERBODY FUSION (TLIF) WITH PEDICLE SCREW FIXATION 1 LEVEL: SHX6141

## 2022-11-19 SURGERY — TRANSFORAMINAL LUMBAR INTERBODY FUSION (TLIF) WITH PEDICLE SCREW FIXATION 1 LEVEL
Anesthesia: General | Laterality: Left

## 2022-11-19 MED ORDER — KETAMINE HCL 50 MG/5ML IJ SOSY
PREFILLED_SYRINGE | INTRAMUSCULAR | Status: AC
  Filled 2022-11-19: qty 5

## 2022-11-19 MED ORDER — BUPIVACAINE-EPINEPHRINE (PF) 0.5% -1:200000 IJ SOLN
INTRAMUSCULAR | Status: AC
  Filled 2022-11-19: qty 30

## 2022-11-19 MED ORDER — CEFAZOLIN SODIUM-DEXTROSE 2-4 GM/100ML-% IV SOLN
2.0000 g | INTRAVENOUS | Status: AC
  Administered 2022-11-19: 2 g via INTRAVENOUS

## 2022-11-19 MED ORDER — HYDROMORPHONE HCL 1 MG/ML IJ SOLN
INTRAMUSCULAR | Status: AC
  Filled 2022-11-19: qty 1

## 2022-11-19 MED ORDER — CHLORHEXIDINE GLUCONATE 0.12 % MT SOLN: 15.0000 mL | Freq: Once | OROMUCOSAL | Status: AC

## 2022-11-19 MED ORDER — OXYCODONE-ACETAMINOPHEN 5-325 MG PO TABS
1.0000 | ORAL_TABLET | ORAL | Status: DC | PRN
  Administered 2022-11-19 – 2022-11-20 (×5): 2 via ORAL
  Filled 2022-11-19 (×5): qty 2

## 2022-11-19 MED ORDER — ACETAMINOPHEN 500 MG PO TABS
ORAL_TABLET | ORAL | Status: AC
  Administered 2022-11-19: 1000 mg via ORAL
  Filled 2022-11-19: qty 2

## 2022-11-19 MED ORDER — LACTATED RINGERS IV SOLN: INTRAVENOUS | Status: DC

## 2022-11-19 MED ORDER — PHENYLEPHRINE HCL-NACL 20-0.9 MG/250ML-% IV SOLN
INTRAVENOUS | Status: AC
  Filled 2022-11-19: qty 250

## 2022-11-19 MED ORDER — KETAMINE HCL 10 MG/ML IJ SOLN
INTRAMUSCULAR | Status: DC | PRN
  Administered 2022-11-19: 50 mg via INTRAVENOUS

## 2022-11-19 MED ORDER — DEXAMETHASONE SODIUM PHOSPHATE 10 MG/ML IJ SOLN
INTRAMUSCULAR | Status: AC
  Filled 2022-11-19: qty 1

## 2022-11-19 MED ORDER — ACETAMINOPHEN 500 MG PO TABS: 1000.0000 mg | ORAL_TABLET | Freq: Once | ORAL | Status: AC

## 2022-11-19 MED ORDER — LIDOCAINE 2% (20 MG/ML) 5 ML SYRINGE
INTRAMUSCULAR | Status: DC | PRN
  Administered 2022-11-19: 60 mg via INTRAVENOUS

## 2022-11-19 MED ORDER — HYDROCODONE-ACETAMINOPHEN 5-325 MG PO TABS: 1.0000 | ORAL_TABLET | ORAL | Status: DC | PRN

## 2022-11-19 MED ORDER — ALUM & MAG HYDROXIDE-SIMETH 200-200-20 MG/5ML PO SUSP: 30.0000 mL | Freq: Four times a day (QID) | ORAL | Status: DC | PRN

## 2022-11-19 MED ORDER — PROPOFOL 10 MG/ML IV BOLUS
INTRAVENOUS | Status: AC
  Filled 2022-11-19: qty 20

## 2022-11-19 MED ORDER — SODIUM CHLORIDE 0.9% FLUSH: 3.0000 mL | INTRAVENOUS | Status: DC | PRN

## 2022-11-19 MED ORDER — ALBUMIN HUMAN 5 % IV SOLN: INTRAVENOUS | Status: DC | PRN

## 2022-11-19 MED ORDER — THROMBIN 20000 UNITS EX KIT
PACK | CUTANEOUS | Status: AC
  Filled 2022-11-19: qty 1

## 2022-11-19 MED ORDER — NAPHAZOLINE-GLYCERIN 0.012-0.25 % OP SOLN: 1.0000 [drp] | Freq: Four times a day (QID) | OPHTHALMIC | Status: DC | PRN

## 2022-11-19 MED ORDER — SUGAMMADEX SODIUM 200 MG/2ML IV SOLN
INTRAVENOUS | Status: DC | PRN
  Administered 2022-11-19: 200 mg via INTRAVENOUS

## 2022-11-19 MED ORDER — CHLORHEXIDINE GLUCONATE 0.12 % MT SOLN
OROMUCOSAL | Status: AC
  Administered 2022-11-19: 15 mL via OROMUCOSAL
  Filled 2022-11-19: qty 15

## 2022-11-19 MED ORDER — DEXMEDETOMIDINE HCL IN NACL 80 MCG/20ML IV SOLN
INTRAVENOUS | Status: DC | PRN
  Administered 2022-11-19: 8 ug via BUCCAL

## 2022-11-19 MED ORDER — BISACODYL 5 MG PO TBEC
5.0000 mg | DELAYED_RELEASE_TABLET | Freq: Every day | ORAL | Status: DC | PRN
  Administered 2022-11-20: 5 mg via ORAL
  Filled 2022-11-19: qty 1

## 2022-11-19 MED ORDER — BUPIVACAINE LIPOSOME 1.3 % IJ SUSP
INTRAMUSCULAR | Status: AC
  Filled 2022-11-19: qty 20

## 2022-11-19 MED ORDER — ROCURONIUM BROMIDE 10 MG/ML (PF) SYRINGE
PREFILLED_SYRINGE | INTRAVENOUS | Status: DC | PRN
  Administered 2022-11-19: 70 mg via INTRAVENOUS
  Administered 2022-11-19: 30 mg via INTRAVENOUS
  Administered 2022-11-19 (×2): 20 mg via INTRAVENOUS

## 2022-11-19 MED ORDER — MORPHINE SULFATE (PF) 2 MG/ML IV SOLN: 1.0000 mg | INTRAVENOUS | Status: DC | PRN

## 2022-11-19 MED ORDER — FENTANYL CITRATE (PF) 250 MCG/5ML IJ SOLN
INTRAMUSCULAR | Status: AC
  Filled 2022-11-19: qty 5

## 2022-11-19 MED ORDER — 0.9 % SODIUM CHLORIDE (POUR BTL) OPTIME
TOPICAL | Status: DC | PRN
  Administered 2022-11-19: 1000 mL

## 2022-11-19 MED ORDER — DOCUSATE SODIUM 100 MG PO CAPS
100.0000 mg | ORAL_CAPSULE | Freq: Two times a day (BID) | ORAL | Status: DC
  Administered 2022-11-19 – 2022-11-20 (×2): 100 mg via ORAL
  Filled 2022-11-19 (×2): qty 1

## 2022-11-19 MED ORDER — PHENYLEPHRINE HCL-NACL 20-0.9 MG/250ML-% IV SOLN
INTRAVENOUS | Status: DC | PRN
  Administered 2022-11-19: 25 ug/min via INTRAVENOUS

## 2022-11-19 MED ORDER — FENTANYL CITRATE (PF) 250 MCG/5ML IJ SOLN
INTRAMUSCULAR | Status: DC | PRN
  Administered 2022-11-19 (×2): 50 ug via INTRAVENOUS
  Administered 2022-11-19: 100 ug via INTRAVENOUS
  Administered 2022-11-19: 50 ug via INTRAVENOUS

## 2022-11-19 MED ORDER — ACETAMINOPHEN 325 MG PO TABS: 650.0000 mg | ORAL_TABLET | ORAL | Status: DC | PRN

## 2022-11-19 MED ORDER — ZOLPIDEM TARTRATE 5 MG PO TABS: 5.0000 mg | ORAL_TABLET | Freq: Every evening | ORAL | Status: DC | PRN

## 2022-11-19 MED ORDER — ROCURONIUM BROMIDE 10 MG/ML (PF) SYRINGE
PREFILLED_SYRINGE | INTRAVENOUS | Status: AC
  Filled 2022-11-19: qty 10

## 2022-11-19 MED ORDER — BUPIVACAINE LIPOSOME 1.3 % IJ SUSP
INTRAMUSCULAR | Status: DC | PRN
  Administered 2022-11-19: 20 mL

## 2022-11-19 MED ORDER — LIDOCAINE 2% (20 MG/ML) 5 ML SYRINGE
INTRAMUSCULAR | Status: AC
  Filled 2022-11-19: qty 5

## 2022-11-19 MED ORDER — BUPIVACAINE-EPINEPHRINE 0.5% -1:200000 IJ SOLN
INTRAMUSCULAR | Status: DC | PRN
  Administered 2022-11-19: 8 mL
  Administered 2022-11-19: 20 mL

## 2022-11-19 MED ORDER — HYDROMORPHONE HCL 1 MG/ML IJ SOLN
0.2500 mg | INTRAMUSCULAR | Status: DC | PRN
  Administered 2022-11-19 (×3): 0.5 mg via INTRAVENOUS

## 2022-11-19 MED ORDER — SODIUM CHLORIDE 0.9% FLUSH
3.0000 mL | Freq: Two times a day (BID) | INTRAVENOUS | Status: DC
  Administered 2022-11-20: 3 mL via INTRAVENOUS

## 2022-11-19 MED ORDER — SENNOSIDES-DOCUSATE SODIUM 8.6-50 MG PO TABS: 1.0000 | ORAL_TABLET | Freq: Every evening | ORAL | Status: DC | PRN

## 2022-11-19 MED ORDER — ONDANSETRON HCL 4 MG/2ML IJ SOLN: 4.0000 mg | Freq: Four times a day (QID) | INTRAMUSCULAR | Status: DC | PRN

## 2022-11-19 MED ORDER — DEXAMETHASONE SODIUM PHOSPHATE 10 MG/ML IJ SOLN
INTRAMUSCULAR | Status: DC | PRN
  Administered 2022-11-19: 10 mg via INTRAVENOUS

## 2022-11-19 MED ORDER — POVIDONE-IODINE 7.5 % EX SOLN: Freq: Once | CUTANEOUS | Status: DC

## 2022-11-19 MED ORDER — ACETAMINOPHEN 650 MG RE SUPP: 650.0000 mg | RECTAL | Status: DC | PRN

## 2022-11-19 MED ORDER — PHENOL 1.4 % MT LIQD: 1.0000 | OROMUCOSAL | Status: DC | PRN

## 2022-11-19 MED ORDER — ONDANSETRON HCL 4 MG/2ML IJ SOLN
INTRAMUSCULAR | Status: DC | PRN
  Administered 2022-11-19: 4 mg via INTRAVENOUS

## 2022-11-19 MED ORDER — METHYLENE BLUE 1 % INJ SOLN
INTRAVENOUS | Status: AC
  Filled 2022-11-19: qty 10

## 2022-11-19 MED ORDER — PHENYLEPHRINE 80 MCG/ML (10ML) SYRINGE FOR IV PUSH (FOR BLOOD PRESSURE SUPPORT)
PREFILLED_SYRINGE | INTRAVENOUS | Status: DC | PRN
  Administered 2022-11-19 (×6): 80 ug via INTRAVENOUS

## 2022-11-19 MED ORDER — MIDAZOLAM HCL 2 MG/2ML IJ SOLN
INTRAMUSCULAR | Status: AC
  Filled 2022-11-19: qty 2

## 2022-11-19 MED ORDER — SODIUM CHLORIDE 0.9 % IV SOLN
250.0000 mL | INTRAVENOUS | Status: DC
  Administered 2022-11-19: 250 mL via INTRAVENOUS

## 2022-11-19 MED ORDER — ONDANSETRON HCL 4 MG/2ML IJ SOLN
INTRAMUSCULAR | Status: AC
  Filled 2022-11-19: qty 2

## 2022-11-19 MED ORDER — PROPOFOL 10 MG/ML IV BOLUS
INTRAVENOUS | Status: DC | PRN
  Administered 2022-11-19: 200 mg via INTRAVENOUS

## 2022-11-19 MED ORDER — CEFAZOLIN SODIUM-DEXTROSE 2-4 GM/100ML-% IV SOLN
2.0000 g | Freq: Three times a day (TID) | INTRAVENOUS | Status: AC
  Administered 2022-11-19 – 2022-11-20 (×2): 2 g via INTRAVENOUS
  Filled 2022-11-19 (×2): qty 100

## 2022-11-19 MED ORDER — POTASSIUM CHLORIDE IN NACL 20-0.9 MEQ/L-% IV SOLN: INTRAVENOUS | Status: DC

## 2022-11-19 MED ORDER — ONDANSETRON HCL 4 MG PO TABS: 4.0000 mg | ORAL_TABLET | Freq: Four times a day (QID) | ORAL | Status: DC | PRN

## 2022-11-19 MED ORDER — CEFAZOLIN SODIUM-DEXTROSE 2-4 GM/100ML-% IV SOLN
INTRAVENOUS | Status: AC
  Filled 2022-11-19: qty 100

## 2022-11-19 MED ORDER — MIDAZOLAM HCL 2 MG/2ML IJ SOLN
INTRAMUSCULAR | Status: DC | PRN
  Administered 2022-11-19: 2 mg via INTRAVENOUS

## 2022-11-19 MED ORDER — THROMBIN 20000 UNITS EX SOLR
CUTANEOUS | Status: DC | PRN
  Administered 2022-11-19: 20000 [IU] via TOPICAL

## 2022-11-19 MED ORDER — MENTHOL 3 MG MT LOZG: 1.0000 | LOZENGE | OROMUCOSAL | Status: DC | PRN

## 2022-11-19 MED ORDER — DEXMEDETOMIDINE HCL IN NACL 80 MCG/20ML IV SOLN
INTRAVENOUS | Status: AC
  Filled 2022-11-19: qty 20

## 2022-11-19 MED ORDER — ORAL CARE MOUTH RINSE: 15.0000 mL | Freq: Once | OROMUCOSAL | Status: AC

## 2022-11-19 MED ORDER — FLEET ENEMA 7-19 GM/118ML RE ENEM: 1.0000 | ENEMA | Freq: Once | RECTAL | Status: DC | PRN

## 2022-11-19 MED ORDER — METHOCARBAMOL 500 MG PO TABS
500.0000 mg | ORAL_TABLET | Freq: Four times a day (QID) | ORAL | Status: DC | PRN
  Administered 2022-11-19 – 2022-11-20 (×3): 500 mg via ORAL
  Filled 2022-11-19 (×3): qty 1

## 2022-11-19 SURGICAL SUPPLY — 87 items
BAG COUNTER SPONGE SURGICOUNT (BAG) ×1 IMPLANT
BENZOIN TINCTURE PRP APPL 2/3 (GAUZE/BANDAGES/DRESSINGS) ×1 IMPLANT
BLADE CLIPPER SURG (BLADE) IMPLANT
BUR PRESCISION 1.7 ELITE (BURR) ×1 IMPLANT
BUR ROUND FLUTED 5 RND (BURR) ×1 IMPLANT
BUR ROUND PRECISION 4.0 (BURR) IMPLANT
BUR SABER RD CUTTING 3.0 (BURR) IMPLANT
CAGE SABLE 10X22 9-15 8D (Cage) IMPLANT
CANNULA GRAFT BNE VG PRE-FILL (Bone Implant) IMPLANT
CLSR STERI-STRIP ANTIMIC 1/2X4 (GAUZE/BANDAGES/DRESSINGS) IMPLANT
CNTNR URN SCR LID CUP LEK RST (MISCELLANEOUS) ×1 IMPLANT
CONT SPEC 4OZ STRL OR WHT (MISCELLANEOUS) ×1
COVER BACK TABLE 60X90IN (DRAPES) ×1 IMPLANT
COVER MAYO STAND STRL (DRAPES) ×2 IMPLANT
COVER SURGICAL LIGHT HANDLE (MISCELLANEOUS) ×1 IMPLANT
DISPENSER GRAFT BNE VG (MISCELLANEOUS) IMPLANT
DISPENSER VIVIGEN BONE GRAFT (MISCELLANEOUS) ×1 IMPLANT
DRAIN CHANNEL 15F RND FF W/TCR (WOUND CARE) IMPLANT
DRAPE C-ARM 42X72 X-RAY (DRAPES) ×1 IMPLANT
DRAPE C-ARMOR (DRAPES) IMPLANT
DRAPE POUCH INSTRU U-SHP 10X18 (DRAPES) ×1 IMPLANT
DRAPE SURG 17X23 STRL (DRAPES) ×4 IMPLANT
DURAPREP 26ML APPLICATOR (WOUND CARE) ×1 IMPLANT
ELECT BLADE 4.0 EZ CLEAN MEGAD (MISCELLANEOUS) ×1
ELECT CAUTERY BLADE 6.4 (BLADE) ×1 IMPLANT
ELECT REM PT RETURN 9FT ADLT (ELECTROSURGICAL) ×1
ELECTRODE BLDE 4.0 EZ CLN MEGD (MISCELLANEOUS) ×1 IMPLANT
ELECTRODE REM PT RTRN 9FT ADLT (ELECTROSURGICAL) ×1 IMPLANT
EVACUATOR SILICONE 100CC (DRAIN) IMPLANT
FILTER STRAW FLUID ASPIR (MISCELLANEOUS) ×1 IMPLANT
GAUZE 4X4 16PLY ~~LOC~~+RFID DBL (SPONGE) ×1 IMPLANT
GAUZE SPONGE 4X4 12PLY STRL (GAUZE/BANDAGES/DRESSINGS) ×1 IMPLANT
GAUZE SPONGE 4X4 12PLY STRL LF (GAUZE/BANDAGES/DRESSINGS) IMPLANT
GLOVE BIO SURGEON STRL SZ7 (GLOVE) ×1 IMPLANT
GLOVE BIO SURGEON STRL SZ8 (GLOVE) ×1 IMPLANT
GLOVE BIOGEL PI IND STRL 7.0 (GLOVE) ×1 IMPLANT
GLOVE BIOGEL PI IND STRL 8 (GLOVE) ×1 IMPLANT
GLOVE SURG ENC MOIS LTX SZ6.5 (GLOVE) ×1 IMPLANT
GOWN STRL REUS W/ TWL LRG LVL3 (GOWN DISPOSABLE) ×2 IMPLANT
GOWN STRL REUS W/ TWL XL LVL3 (GOWN DISPOSABLE) ×1 IMPLANT
GOWN STRL REUS W/TWL LRG LVL3 (GOWN DISPOSABLE) ×2
GOWN STRL REUS W/TWL XL LVL3 (GOWN DISPOSABLE) ×1
GRAFT BONE CANNULA VIVIGEN 3 (Bone Implant) ×3 IMPLANT
IV CATH 14GX2 1/4 (CATHETERS) ×1 IMPLANT
KIT BASIN OR (CUSTOM PROCEDURE TRAY) ×1 IMPLANT
KIT POSITION SURG JACKSON T1 (MISCELLANEOUS) ×1 IMPLANT
KIT TURNOVER KIT B (KITS) ×1 IMPLANT
MARKER SKIN DUAL TIP RULER LAB (MISCELLANEOUS) ×2 IMPLANT
NDL 18GX1X1/2 (RX/OR ONLY) (NEEDLE) ×1 IMPLANT
NDL 22X1.5 STRL (OR ONLY) (MISCELLANEOUS) ×2 IMPLANT
NDL HYPO 25GX1X1/2 BEV (NEEDLE) ×1 IMPLANT
NDL SPNL 18GX3.5 QUINCKE PK (NEEDLE) ×2 IMPLANT
NEEDLE 18GX1X1/2 (RX/OR ONLY) (NEEDLE) ×1 IMPLANT
NEEDLE 22X1.5 STRL (OR ONLY) (MISCELLANEOUS) ×2 IMPLANT
NEEDLE HYPO 25GX1X1/2 BEV (NEEDLE) ×1 IMPLANT
NEEDLE SPNL 18GX3.5 QUINCKE PK (NEEDLE) ×2 IMPLANT
NS IRRIG 1000ML POUR BTL (IV SOLUTION) ×1 IMPLANT
PACK LAMINECTOMY ORTHO (CUSTOM PROCEDURE TRAY) ×1 IMPLANT
PACK UNIVERSAL I (CUSTOM PROCEDURE TRAY) ×1 IMPLANT
PAD ARMBOARD 7.5X6 YLW CONV (MISCELLANEOUS) ×2 IMPLANT
PATTIES SURGICAL .5 X1 (DISPOSABLE) ×1 IMPLANT
PATTIES SURGICAL .5X1.5 (GAUZE/BANDAGES/DRESSINGS) ×1 IMPLANT
ROD PRE BENT EXP 40MM (Rod) IMPLANT
SCREW SET SINGLE INNER (Screw) IMPLANT
SCREW VIPER CORT FIX 6.00X30 (Screw) IMPLANT
SOL ANTI FOG 6CC (MISCELLANEOUS) IMPLANT
SPONGE INTESTINAL PEANUT (DISPOSABLE) ×1 IMPLANT
SPONGE SURGIFOAM ABS GEL 100 (HEMOSTASIS) ×1 IMPLANT
SPONGE T-LAP 4X18 ~~LOC~~+RFID (SPONGE) IMPLANT
STRIP CLOSURE SKIN 1/2X4 (GAUZE/BANDAGES/DRESSINGS) ×2 IMPLANT
SURGIFLO W/THROMBIN 8M KIT (HEMOSTASIS) IMPLANT
SUT MNCRL AB 4-0 PS2 18 (SUTURE) ×1 IMPLANT
SUT VIC AB 0 CT1 18XCR BRD 8 (SUTURE) ×1 IMPLANT
SUT VIC AB 0 CT1 8-18 (SUTURE) ×1
SUT VIC AB 1 CT1 18XCR BRD 8 (SUTURE) ×1 IMPLANT
SUT VIC AB 1 CT1 8-18 (SUTURE) ×1
SUT VIC AB 2-0 CT2 18 VCP726D (SUTURE) ×1 IMPLANT
SYR 20ML LL LF (SYRINGE) ×2 IMPLANT
SYR BULB IRRIG 60ML STRL (SYRINGE) ×1 IMPLANT
SYR CONTROL 10ML LL (SYRINGE) ×2 IMPLANT
SYR TB 1ML LUER SLIP (SYRINGE) ×1 IMPLANT
TAP EXPEDIUM DL 5.0 (INSTRUMENTS) IMPLANT
TAP EXPEDIUM DL 6.0 (INSTRUMENTS) IMPLANT
TRAY FOLEY MTR SLVR 16FR STAT (SET/KITS/TRAYS/PACK) ×1 IMPLANT
TUBE FUNNEL GL DISP (ORTHOPEDIC DISPOSABLE SUPPLIES) IMPLANT
WATER STERILE IRR 1000ML POUR (IV SOLUTION) ×1 IMPLANT
YANKAUER SUCT BULB TIP NO VENT (SUCTIONS) ×1 IMPLANT

## 2022-11-19 NOTE — Anesthesia Postprocedure Evaluation (Signed)
Anesthesia Post Note  Patient: Mathew Vega  Procedure(s) Performed: LEFT-SIDED TRANSFORAMINAL LUMBAR INTERBODY FUSION AND DECOMPRESSION LUMBAR 4- LUMBAR 5 WITH INSTRUMENTATION AND ALLOGRAFT (Left)     Patient location during evaluation: PACU Anesthesia Type: General Level of consciousness: awake and alert Pain management: pain level controlled Vital Signs Assessment: post-procedure vital signs reviewed and stable Respiratory status: spontaneous breathing, nonlabored ventilation and respiratory function stable Cardiovascular status: blood pressure returned to baseline and stable Postop Assessment: no apparent nausea or vomiting Anesthetic complications: no  No notable events documented.  Last Vitals:  Vitals:   11/19/22 1230 11/19/22 1245  BP: (!) 136/94 (!) 146/88  Pulse: 100 100  Resp: 17 13  Temp:    SpO2: 96% 98%    Last Pain:  Vitals:   11/19/22 1245  TempSrc:   PainSc: 4                  [name],W. EDMOND

## 2022-11-19 NOTE — Transfer of Care (Signed)
Immediate Anesthesia Transfer of Care Note  Patient: Mathew Vega  Procedure(s) Performed: LEFT-SIDED TRANSFORAMINAL LUMBAR INTERBODY FUSION AND DECOMPRESSION LUMBAR 4- LUMBAR 5 WITH INSTRUMENTATION AND ALLOGRAFT (Left)  Patient Location: PACU  Anesthesia Type:General  Level of Consciousness: awake, alert , oriented, patient cooperative, and responds to stimulation  Airway & Oxygen Therapy: Patient Spontanous Breathing and Patient connected to face mask oxygen  Post-op Assessment: Report given to RN, Post -op Vital signs reviewed and stable, and Patient moving all extremities X 4  Post vital signs: Reviewed and stable  Last Vitals:  Vitals Value Taken Time  BP 132/95 11/19/22 1139  Temp    Pulse 109 11/19/22 1141  Resp 20 11/19/22 1141  SpO2 99 % 11/19/22 1141  Vitals shown include unvalidated device data.  Last Pain:  Vitals:   11/19/22 0636  TempSrc: Oral  PainSc:       Patients Stated Pain Goal: 3 (11/19/22 7622)  Complications: No notable events documented.

## 2022-11-19 NOTE — Op Note (Signed)
PATIENT NAME: Mathew Vega   MEDICAL RECORD NO.:   [id number]   DATE OF BIRTH: 1966-03-01   DATE OF PROCEDURE: 11/19/2022                               OPERATIVE REPORT     PREOPERATIVE DIAGNOSES: 1. Bilateral lumbar radiculopathy 2. L4-5 spinal stenosis 3. L4-5 spondylolisthesis   POSTOPERATIVE DIAGNOSES: 1. Bilateral lumbar radiculopathy 2. L4-5 spinal stenosis 3. L4-5 spondylolisthesis   PROCEDURES: 1. L4/5 decompression 2. Left-sided L4-5 transforaminal lumbar interbody fusion. 3. Right-sided L4-5 posterolateral fusion. 4. Insertion of interbody device x1 (Globus expandable intervertebral spacer). 5. Placement of segmental posterior instrumentation L4, L5 bilaterally  6. Use of local autograft. 7. Use of morselized allograft - Vivigen 8. Intraoperative use of fluoroscopy.   SURGEON:  Estill Bamberg, MD.   ASSISTANTJason Coop, PA-C.   ANESTHESIA:  General endotracheal anesthesia.   COMPLICATIONS:  None.   DISPOSITION:  Stable.   ESTIMATED BLOOD LOSS:  250cc   INDICATIONS FOR SURGERY:  Briefly,  [name] is a pleasant 56 year old male who did present to me with severe and ongoing pain in the right and left legs. I did feel that the symptoms were secondary to the findings noted above. The patient failed conservative care and did wish to proceed with the procedure  noted above.   OPERATIVE DETAILS:  On 11/19/2022, the patient was brought to surgery and general endotracheal anesthesia was administered.  The patient was placed prone on a well-padded flat Jackson bed with a spinal frame.  Antibiotics were given and a time-out procedure was performed. The back was prepped and draped in the usual fashion.  A midline incision was made overlying the L4-5 intervertebral spaces.  The fascia was incised at the midline.  The paraspinal musculature was bluntly swept laterally.  Anatomic landmarks for the pedicles were exposed. Using fluoroscopy, I did cannulate the  L4 and L5 pedicles bilaterally, using a medial to lateral cortical trajectory technique.  At this point, 6 x 30 mm screws were placed into the right pedicles, and a 40 mm rod was placed into the tulip heads of the screw, and caps were also placed. Distraction was then applied across the L4-5 intervertebral space, and the caps were then provisionally tightened.  On the left side, bone wax was placed into the cannulated pedicle holes.  I then proceeded with the decompressive aspect of the procedure at the L4-5 level.  A partial facetectomy was performed bilaterally at L4-5, decompressing the L4-5 intervertebral space.  I was very pleased with the decompression. With an assistant holding medial retraction of the traversing left L5 nerve, I did perform an annulotomy at the posterolateral aspect of the L4-5 intervertebral space.  I then used a series of curettes and pituitary rongeurs to perform a thorough and complete intervertebral diskectomy.  The intervertebral space was then liberally packed with autograft as well as allograft in the form of Vivigen, as was the appropriate-sized intervertebral spacer. The spacer was then tamped into position in the usual fashion, and expanded to approximately 12.1 mm.  I was very pleased with the press-fit of the spacer.  I then placed 6 mm screws on the left at L4 and L5. A 40-mm rod was then placed and caps were placed. The distraction was then released on the contralateral side.  All caps were then locked.  The wound was copiously irrigated with a total of  approximately 3 L prior to placing the bone graft.  Additional autograft and allograft was then packed into the posterolateral gutter on the right side to help aid in the success of the fusion.  The wound was  explored for any undue bleeding and there was no substantial bleeding encountered.  Gel-Foam was placed over the laminectomy site.  The wound was then closed in layers using #1 Vicryl followed by 2-0  Vicryl, followed by 4-0 Monocryl.  Benzoin and Steri-Strips were applied followed by sterile dressing. A #15 blake drain was placed deep to the fascia prior to fascial closure.     Of note, Jason Coop was my assistant throughout surgery, and did aid in retraction, suctioning, placement of the hardware, and closure.     Estill Bamberg, MD

## 2022-11-19 NOTE — H&P (Signed)
PREOPERATIVE H&P  Chief Complaint: Bilateral leg pain and weakness  HPI: Mathew Vega is a 56 y.o. male who presents with ongoing pain in the bilateral legs  MRI reveals stenosis and instability at L4/5  Patient has failed multiple forms of conservative care and continues to have pain (see office notes for additional details regarding the patient's full course of treatment)  Past Medical History:  Diagnosis Date   Arthritis    Past Surgical History:  Procedure Laterality Date   APPENDECTOMY  12/07/1978   COLONOSCOPY  2021   HIP ARTHROSCOPY Left 12/07/2008   INJECTION KNEE Left 05/04/2014   Procedure: CORTISONE INJECTION LEFT KNEE;  Surgeon: Nestor Lewandowsky, MD;  Location: [facility] OR;  Service: Orthopedics;  Laterality: Left;   RHINOPLASTY  12/07/2000   SHOULDER ARTHROSCOPY Right 12/08/2011   cartilage repair   SHOULDER ARTHROSCOPY Left    TOTAL HIP ARTHROPLASTY Left 05/04/2014   Procedure: TOTAL HIP ARTHROPLASTY;  Surgeon: Nestor Lewandowsky, MD;  Location: [facility] OR;  Service: Orthopedics;  Laterality: Left;   Social History   Socioeconomic History   Marital status: Married    Spouse name: Not on file   Number of children: Not on file   Years of education: Not on file   Highest education level: Not on file  Occupational History   Not on file  Tobacco Use   Smoking status: Never   Smokeless tobacco: Not on file  Vaping Use   Vaping Use: Never used  Substance and Sexual Activity   Alcohol use: Yes    Alcohol/week: 4.0 standard drinks of alcohol    Types: 4 Glasses of wine per week   Drug use: No   Sexual activity: Yes  Other Topics Concern   Not on file  Social History Narrative   Not on file   Social Determinants of Health   Financial Resource Strain: Not on file  Food Insecurity: Not on file  Transportation Needs: Not on file  Physical Activity: Not on file  Stress: Not on file  Social Connections: Not on file   History reviewed. No pertinent family  history. No Known Allergies Prior to Admission medications   Medication Sig Start Date End Date Taking? Authorizing Provider  Bioflavonoid Products (QUERCETIN COMPLEX IMMUNE PO) Take 1 capsule by mouth in the morning. With Vitamin C/Vitamin D3/ Zinc   Yes [provider]  methocarbamol (ROBAXIN) 500 MG tablet Take 1 tablet (500 mg total) by mouth 2 (two) times daily with a meal. Patient taking differently: Take 500 mg by mouth at bedtime. 05/04/14  Yes Allena Katz, PA-C  Multiple Vitamin (MULTIVITAMIN WITH MINERALS) TABS tablet Take 1 tablet by mouth in the morning.   Yes [provider]  tetrahydrozoline 0.05 % ophthalmic solution Place 1 drop into both eyes in the morning.   Yes [provider]  traMADol (ULTRAM) 50 MG tablet Take 50 mg by mouth 3 (three) times daily as needed (pain). 11/06/22  Yes [provider]     All other systems have been reviewed and were otherwise negative with the exception of those mentioned in the HPI and as above.  Physical Exam: There were no vitals filed for this visit.  There is no height or weight on file to calculate BMI.  General: Alert, no acute distress Cardiovascular: No pedal edema Respiratory: No cyanosis, no use of accessory musculature Skin: No lesions in the area of chief complaint Neurologic: Sensation intact distally Psychiatric: Patient is  competent for consent with normal mood and affect Lymphatic: No axillary or cervical lymphadenopathy  Assessment/Plan: Spinal stenosis, spondylolisthesis L4-L5 resulting in bilateral leg pain Plan for Procedure(s): LEFT-SIDED TRANSFORAMINAL LUMBAR INTERBODY FUSION AND DECOMPRESSION LUMBAR 4- LUMBAR 5 WITH INSTRUMENTATION AND ALLOGRAFT   Jackelyn Hoehn, MD 11/19/2022 6:35 AM

## 2022-11-19 NOTE — Anesthesia Procedure Notes (Signed)
Procedure Name: Intubation Date/Time: 11/19/2022 7:49 AM  Performed by: Michele Rockers, CRNAPre-anesthesia Checklist: Patient identified, Patient being monitored, Timeout performed, Emergency Drugs available and Suction available Patient Re-evaluated:Patient Re-evaluated prior to induction Oxygen Delivery Method: Circle system utilized Preoxygenation: Pre-oxygenation with 100% oxygen Induction Type: IV induction Ventilation: Mask ventilation without difficulty and Oral airway inserted - appropriate to patient size Laryngoscope Size: Mac, 4 and Glidescope Grade View: Grade I Tube type: Oral Tube size: 7.5 mm Number of attempts: 3 Airway Equipment and Method: Stylet Placement Confirmation: ETT inserted through vocal cords under direct vision, positive ETCO2 and breath sounds checked- equal and bilateral Secured at: 22 cm Tube secured with: Tape Dental Injury: Teeth and Oropharynx as per pre-operative assessment  Difficulty Due To: Difficult Airway- due to anterior larynx Comments: Attempted with Mill 3, Mac 4, unable to visualized VC, glide used grade 1 view.

## 2022-11-20 DIAGNOSIS — M4316 Spondylolisthesis, lumbar region: Secondary | ICD-10-CM | POA: Diagnosis not present

## 2022-11-20 MED ORDER — OXYCODONE-ACETAMINOPHEN 5-325 MG PO TABS: 1.0000 | ORAL_TABLET | ORAL | 0 refills | Status: AC | PRN

## 2022-11-20 MED ORDER — METHOCARBAMOL 500 MG PO TABS: 500.0000 mg | ORAL_TABLET | Freq: Four times a day (QID) | ORAL | 2 refills | Status: AC | PRN

## 2022-11-20 MED FILL — Thrombin For Soln Kit 20000 Unit: CUTANEOUS | Qty: 1 | Status: AC

## 2022-11-20 NOTE — Progress Notes (Signed)
JP drain removed per order, site clean and dry.

## 2022-11-20 NOTE — Progress Notes (Signed)
    Patient doing well  Denies leg pain Has been ambulating   Physical Exam: Vitals:   11/19/22 2339 11/20/22 0546  BP: (!) 173/95 (!) 163/95  Pulse: 98 76  Resp: 20 20  Temp: 98.4 F (36.9 C) 98.2 F (36.8 C)  SpO2: 98% 95%    Dressing in place NVI  Drain output: 55cc over 12 hours  POD #1 s/p L4/5 decompression and fusion, doing well  - up with PT/OT, encourage ambulation - Percocet for pain, Robaxin for muscle spasms - d/c home today with f/u in 2 weeks - d/c drain prior to discharge

## 2022-11-20 NOTE — Evaluation (Signed)
Occupational Therapy Evaluation Patient Details Name: Mathew Vega MRN: [id number] DOB: December 06, 1966 Today's Date: 11/20/2022   History of Present Illness Patient is a 56 y/o male who presents on 12/14 for elective left sided TLIF and decompression at L4-5. PMH includes left THA,   Clinical Impression   Patient admitted for the diagnosis above.  PTA he lives with his spouse, works, and needed no assist with any aspect of ADL,iADL, or mobility.  Precautions reviewed during functional task with good follow through.  No further OT needs in the acute setting.  All questions answered.  Recommend follow up as prescribed by MD.        Recommendations for follow up therapy are one component of a multi-disciplinary discharge planning process, led by the attending physician.  Recommendations may be updated based on patient status, additional functional criteria and insurance authorization.   Follow Up Recommendations  No OT follow up     Assistance Recommended at Discharge PRN  Patient can return home with the following Assist for transportation    Functional Status Assessment  Patient has not had a recent decline in their functional status  Equipment Recommendations  None recommended by OT    Recommendations for Other Services       Precautions / Restrictions Precautions Precautions: Back Precaution Booklet Issued: Yes (comment) Precaution Comments: Provided handout and reviewed precautions Required Braces or Orthoses: Spinal Brace Spinal Brace: Lumbar corset;Applied in standing position Restrictions Weight Bearing Restrictions: No      Mobility Bed Mobility                    Transfers Overall transfer level: Independent Equipment used: None                      Balance Overall balance assessment: No apparent balance deficits (not formally assessed)                                         ADL either performed or assessed with clinical  judgement   ADL Overall ADL's : At baseline                                             Vision Patient Visual Report: No change from baseline       Perception     Praxis      Pertinent Vitals/Pain Pain Assessment Pain Assessment: Faces Faces Pain Scale: Hurts a little bit Pain Location: back Pain Descriptors / Indicators: Operative site guarding, Sore Pain Intervention(s): Monitored during session     Hand Dominance Right   Extremity/Trunk Assessment Upper Extremity Assessment Upper Extremity Assessment: Overall WFL for tasks assessed   Lower Extremity Assessment Lower Extremity Assessment: Defer to PT evaluation   Cervical / Trunk Assessment Cervical / Trunk Assessment: Back Surgery   Communication Communication Communication: No difficulties   Cognition Arousal/Alertness: Awake/alert Behavior During Therapy: WFL for tasks assessed/performed Overall Cognitive Status: Within Functional Limits for tasks assessed                                       General Comments  Bandage- clean dry and intact. Able to donn brace  independently in standing. Wife present.    Exercises     Shoulder Instructions      Home Living Family/patient expects to be discharged to:: Private residence Living Arrangements: Spouse/significant other Available Help at Discharge: Family;Available PRN/intermittently Type of Home: House Home Access: Stairs to enter [employer] of Steps: 3 Entrance Stairs-Rails: Left Home Layout: Two level Alternate Level Stairs-Number of Steps: 1 flight   Bathroom Shower/Tub: Walk-in shower;Tub/shower unit   Bathroom Toilet: Standard Bathroom Accessibility: Yes How Accessible: Accessible via walker Home Equipment: Adaptive equipment;Rolling Walker (2 wheels) Adaptive Equipment: Reacher;Long-handled shoe horn        Prior Functioning/Environment Prior Level of Function : Independent/Modified  Independent             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, independent ADLs Comments: independent        OT Problem List: Pain      OT Treatment/Interventions:      OT Goals(Current goals can be found in the care plan section) Acute Rehab OT Goals Patient Stated Goal: Return home OT Goal Formulation: With patient Time For Goal Achievement: 11/23/22 Potential to Achieve Goals: Good  OT Frequency:      Co-evaluation              AM-PAC OT "6 Clicks" Daily Activity     Outcome Measure Help from another person eating meals?: None Help from another person taking care of personal grooming?: None Help from another person toileting, which includes using toliet, bedpan, or urinal?: None Help from another person bathing (including washing, rinsing, drying)?: None Help from another person to put on and taking off regular upper body clothing?: None Help from another person to put on and taking off regular lower body clothing?: None 6 Click Score: 24   End of Session Nurse Communication: Mobility status  Activity Tolerance: Patient tolerated treatment well Patient left: in chair;with call bell/phone within reach  OT Visit Diagnosis: Muscle weakness (generalized) (M62.81)                Time: 0175-1025 OT Time Calculation (min): 21 min Charges:  OT General Charges $OT Visit: 1 Visit OT Evaluation $OT Eval Moderate Complexity: 1 Mod  11/20/2022  RP, OTR/L  Acute Rehabilitation Services  Office:  [phone]   Suzanna Obey 11/20/2022, 9:09 AM

## 2022-11-20 NOTE — Evaluation (Signed)
Physical Therapy Evaluation Patient Details Name: Mathew Vega MRN: [id number] DOB: 1966-03-03 Today's Date: 11/20/2022  History of Present Illness  Patient is a 56 y/o male who presents on 12/14 for elective left sided TLIF and decompression at L4-5. PMH includes left THA,  Clinical Impression  Patient presents with pain and post surgical deficits s/p above surgery. 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 Today, pt tolerated transfers, gait training and stair training Mod I with use of RW for support. Pt prefers using RW for safety at this time and will borrow one from a friend when he gets home. Education re: back precautions, log roll technique, positioning, brace management/wearing, car transfer, stairs etc. Pt will have support from wife at home. Does not require further skilled therapy services. All education completed. Discharge from therapy.       Recommendations for follow up therapy are one component of a multi-disciplinary discharge planning process, led by the attending physician.  Recommendations may be updated based on patient status, additional functional criteria and insurance authorization.  Follow Up Recommendations No PT follow up      Assistance Recommended at Discharge PRN  Patient can return home with the following  Assistance with cooking/housework;Assist for transportation    Equipment Recommendations None recommended by PT  Recommendations for Other Services       Functional Status Assessment Patient has had a recent decline in their functional status and demonstrates the ability to make significant improvements in function in a reasonable and predictable amount of time.     Precautions / Restrictions Precautions Precautions: Back Precaution Booklet Issued: Yes (comment) Precaution Comments: Provided handout and reviewed precautions Required Braces or Orthoses: Spinal Brace Spinal Brace: Lumbar corset;Applied in standing  position Restrictions Weight Bearing Restrictions: No      Mobility  Bed Mobility Overal bed mobility: Modified Independent             General bed mobility comments: HOB Flat and no rails to simulate home, cues for log roll technique.    Transfers Overall transfer level: Modified independent Equipment used: None               General transfer comment: Stood from EOB without difficulty.    Ambulation/Gait Ambulation/Gait assistance: Modified independent (Device/Increase time) Gait Distance (Feet): 350 Feet Assistive device: Rolling walker (2 wheels) Gait Pattern/deviations: Step-through pattern, Decreased stride length   Gait velocity interpretation: 1.31 - 2.62 ft/sec, indicative of limited community ambulator   General Gait Details: Steady gait with use of RW.  Stairs Stairs: Yes Stairs assistance: Modified independent (Device/Increase time) Stair Management: One rail Right, Step to pattern, Alternating pattern, Forwards Number of Stairs: 13 General stair comments: Cues for technique, started with step to pattern and progressed to step through pattern.  Wheelchair Mobility    Modified Rankin (Stroke Patients Only)       Balance Overall balance assessment: No apparent balance deficits (not formally assessed)                                           Pertinent Vitals/Pain Pain Assessment Pain Assessment: Faces Faces Pain Scale: Hurts a little bit Pain Location: back Pain Descriptors / Indicators: Operative site guarding, Sore Pain Intervention(s): Monitored during session    Home Living Family/patient expects to be discharged to:: Private residence Living Arrangements: Spouse/significant other Available Help at Discharge: Family;Available PRN/intermittently  Type of Home: House Home Access: Stairs to enter Entrance Stairs-Rails: Left Entrance Stairs-Number of Steps: 3 Alternate Level Stairs-Number of Steps: 1 flight Home  Layout: Two level Home Equipment: Adaptive equipment      Prior Function Prior Level of Function : Independent/Modified Independent             Mobility Comments: Works as a professor, independent ADLs Comments: independent     Higher education careers adviser   Dominant Hand: Right    Extremity/Trunk Assessment   Upper Extremity Assessment Upper Extremity Assessment: Defer to OT evaluation    Lower Extremity Assessment Lower Extremity Assessment: Overall WFL for tasks assessed    Cervical / Trunk Assessment Cervical / Trunk Assessment: Back Surgery  Communication   Communication: No difficulties  Cognition Arousal/Alertness: Awake/alert Behavior During Therapy: WFL for tasks assessed/performed Overall Cognitive Status: Within Functional Limits for tasks assessed                                          General Comments General comments (skin integrity, edema, etc.): Bandage- clean dry and intact. Able to donn brace independently in standing. Wife present.    Exercises     Assessment/Plan    PT Assessment Patient does not need any further PT services  PT Problem List         PT Treatment Interventions      PT Goals (Current goals can be found in the Care Plan section)  Acute Rehab PT Goals Patient Stated Goal: to get back to disc golf PT Goal Formulation: All assessment and education complete, DC therapy    Frequency       Co-evaluation               AM-PAC PT "6 Clicks" Mobility  Outcome Measure Help needed turning from your back to your side while in a flat bed without using bedrails?: None Help needed moving from lying on your back to sitting on the side of a flat bed without using bedrails?: None Help needed moving to and from a bed to a chair (including a wheelchair)?: None Help needed standing up from a chair using your arms (e.g., wheelchair or bedside chair)?: None Help needed to walk in hospital room?: None Help needed climbing 3-5  steps with a railing? : None 6 Click Score: 24    End of Session Equipment Utilized During Treatment: Back brace Activity Tolerance: Patient tolerated treatment well Patient left: Other (comment) (standing in room with MD) Nurse Communication: Mobility status PT Visit Diagnosis: Pain;Difficulty in walking, not elsewhere classified (R26.2) Pain - part of body:  (back)    Time: 2536-6440 PT Time Calculation (min) (ACUTE ONLY): 27 min   Charges:   PT Evaluation $PT Eval Low Complexity: 1 Low PT Treatments $Gait Training: 8-22 mins        Army Melia, DPT Acute Rehabilitation Services Secure chat preferred Office [phone]     Blake Divine A Haddon Fyfe 11/20/2022, 8:32 AM

## 2022-11-20 NOTE — Plan of Care (Signed)
  Problem: Education: Goal: Ability to verbalize activity precautions or restrictions will improve Outcome: Completed/Met Goal: Knowledge of the prescribed therapeutic regimen will improve Outcome: Completed/Met Goal: Understanding of discharge needs will improve Outcome: Completed/Met  Patient alert and oriented, void, ambulate. Surgical site is clean and dry no sign of infection. D/c instructions explain and given to the patient. All questions answered. Will d/c  home per order.

## 2022-11-24 ENCOUNTER — Encounter (HOSPITAL_COMMUNITY): Payer: Self-pay | Admitting: Orthopedic Surgery

## 2022-12-02 ENCOUNTER — Encounter (HOSPITAL_COMMUNITY): Payer: Self-pay | Admitting: Orthopedic Surgery

## 2022-12-02 NOTE — Discharge Summary (Signed)
Patient ID: Mathew Vega MRN: [id number] DOB/AGE: 58-09-1966 58 y.o.  Admit date: 11/19/2022 Discharge date: 12/02/2022  Admission Diagnoses:  Principal Problem:   Radiculopathy   Discharge Diagnoses:  Same  Past Medical History:  Diagnosis Date   Arthritis     Surgeries: Procedure(s): LEFT-SIDED TRANSFORAMINAL LUMBAR INTERBODY FUSION AND DECOMPRESSION LUMBAR 4- LUMBAR 5 WITH INSTRUMENTATION AND ALLOGRAFT on 11/19/2022   Consultants: None  Discharged Condition: Improved  Hospital Course: Mathew Vega is an 56 y.o. male who was admitted 11/19/2022 for operative treatment of Radiculopathy. Patient has severe unremitting pain that affects sleep, daily activities, and work/hobbies. After pre-op clearance the patient was taken to the operating room on 11/19/2022 and underwent  Procedure(s): LEFT-SIDED TRANSFORAMINAL LUMBAR INTERBODY FUSION AND DECOMPRESSION LUMBAR 4- LUMBAR 5 WITH INSTRUMENTATION AND ALLOGRAFT.    Patient was given perioperative antibiotics:  Anti-infectives (From admission, onward)    Start     Dose/Rate Route Frequency Ordered Stop   11/19/22 1600  ceFAZolin (ANCEF) IVPB 2g/100 mL premix        2 g 200 mL/hr over 30 Minutes Intravenous Every 8 hours 11/19/22 1334 11/20/22 0031   11/19/22 0601  ceFAZolin (ANCEF) 2-4 GM/100ML-% IVPB       Note to Pharmacy: Kandice Hams D: cabinet override      11/19/22 0601 11/19/22 0806   11/19/22 0600  ceFAZolin (ANCEF) IVPB 2g/100 mL premix        2 g 200 mL/hr over 30 Minutes Intravenous On call to O.R. 11/19/22 7619 11/19/22 5093        Patient was given sequential compression devices, early ambulation to prevent DVT.  Patient benefited maximally from hospital stay and there were no complications.    Recent vital signs: BP (!) 163/95 (BP Location: Left Arm)   Pulse 76   Temp 98.2 F (36.8 C) (Oral)   Resp 20   Ht 5\' 11"  (1.803 m)   Wt 85.7 kg   SpO2 95%   BMI 26.36 kg/m    Discharge  Medications:   Allergies as of 11/20/2022   No Known Allergies      Medication List     TAKE these medications    methocarbamol 500 MG tablet Commonly known as: Robaxin Take 1 tablet (500 mg total) by mouth 2 (two) times daily with a meal. What changed: when to take this   methocarbamol 500 MG tablet Commonly known as: Robaxin Take 1 tablet (500 mg total) by mouth every 6 (six) hours as needed for muscle spasms. What changed: You were already taking a medication with the same name, and this prescription was added. Make sure you understand how and when to take each.   multivitamin with minerals Tabs tablet Take 1 tablet by mouth in the morning.   oxyCODONE-acetaminophen 5-325 MG tablet Commonly known as: PERCOCET/ROXICET Take 1-2 tablets by mouth every 4 (four) hours as needed for severe pain.   QUERCETIN COMPLEX IMMUNE PO Take 1 capsule by mouth in the morning. With Vitamin C/Vitamin D3/ Zinc   tetrahydrozoline 0.05 % ophthalmic solution Place 1 drop into both eyes in the morning.   traMADol 50 MG tablet Commonly known as: ULTRAM Take 50 mg by mouth 3 (three) times daily as needed (pain).        Diagnostic Studies: DG Lumbar Spine 1 View  Result Date: 11/19/2022 CLINICAL DATA:  Localization. Elective surgery. EXAM: LUMBAR SPINE - 1 VIEW COMPARISON:  None Available. FINDINGS: Single cross-table lateral view of the  lumbar spine obtained in the operating room. Lower most lumbar vertebra is labeled L5. Surgical instruments are seen posteriorly localizing to the L3-L4 disc space and L3 spinous processes. IMPRESSION: Single cross-table lateral view of the lumbar spine for surgical localization. Electronically Signed   By: Narda Rutherford M.D.   On: 11/19/2022 12:28   DG Lumbar Spine 2-3 Views  Result Date: 11/19/2022 CLINICAL DATA:  Elective surgery. EXAM: LUMBAR SPINE - 2-3 VIEW COMPARISON:  None Available. FINDINGS: Two fluoroscopic spot views of the lumbar spine  obtained in the operating room in frontal and lateral projection. Posterior rod and intrapedicular screw fusion with interbody spacer at L4-L5 (assuming lower most lumbar vertebra is L5 level). Fluoroscopy time 38.5 seconds. Dose 7.28 mGy. IMPRESSION: Intraoperative fluoroscopy during lumbar fusion. Electronically Signed   By: Narda Rutherford M.D.   On: 11/19/2022 12:26   DG C-Arm 1-60 Min-No Report  Result Date: 11/19/2022 Fluoroscopy was utilized by the requesting physician.  No radiographic interpretation.   DG C-Arm 1-60 Min-No Report  Result Date: 11/19/2022 Fluoroscopy was utilized by the requesting physician.  No radiographic interpretation.   DG C-Arm 1-60 Min-No Report  Result Date: 11/19/2022 Fluoroscopy was utilized by the requesting physician.  No radiographic interpretation.    Disposition: Discharge disposition: 01-Home or Self Care        POD #1 s/p L4/5 decompression and fusion, doing well   - up with PT/OT, encourage ambulation - Percocet for pain, Robaxin for muscle spasms -Scripts for pain sent to pharmacy electronically  -D/C instructions sheet printed and in chart -D/C today  -F/U in office 2 weeks   Signed: Eilene Ghazi Besan Ketchem 12/02/2022, 11:47 AM
